# Patient Record
Sex: Female | Born: 1940 | Race: White | Hispanic: Yes | Marital: Married | Smoking: Never smoker
Health system: Southern US, Community
[De-identification: ages and names within clinical notes are randomized; demographics above are authoritative.]

## PROBLEM LIST (undated history)

## (undated) DIAGNOSIS — E079 Disorder of thyroid, unspecified: Secondary | ICD-10-CM

## (undated) DIAGNOSIS — M8580 Other specified disorders of bone density and structure, unspecified site: Secondary | ICD-10-CM

## (undated) DIAGNOSIS — M810 Age-related osteoporosis without current pathological fracture: Secondary | ICD-10-CM

## (undated) DIAGNOSIS — E559 Vitamin D deficiency, unspecified: Secondary | ICD-10-CM

## (undated) DIAGNOSIS — G500 Trigeminal neuralgia: Secondary | ICD-10-CM

## (undated) DIAGNOSIS — E039 Hypothyroidism, unspecified: Secondary | ICD-10-CM

## (undated) HISTORY — PX: TONSILLECTOMY: SUR1361

## (undated) HISTORY — PX: TUBAL LIGATION: SHX77

## (undated) HISTORY — DX: Hypothyroidism, unspecified: E03.9

## (undated) HISTORY — PX: BREAST EXCISIONAL BIOPSY: SUR124

## (undated) HISTORY — DX: Other specified disorders of bone density and structure, unspecified site: M85.80

## (undated) HISTORY — DX: Vitamin D deficiency, unspecified: E55.9

## (undated) HISTORY — PX: CATARACT EXTRACTION: SUR2

## (undated) HISTORY — PX: OTHER SURGICAL HISTORY: SHX169

## (undated) HISTORY — DX: Trigeminal neuralgia: G50.0

## (undated) HISTORY — PX: APPENDECTOMY: SHX54

---

## 2011-06-25 ENCOUNTER — Other Ambulatory Visit: Payer: Self-pay | Admitting: Family Medicine

## 2011-06-25 DIAGNOSIS — Z1231 Encounter for screening mammogram for malignant neoplasm of breast: Secondary | ICD-10-CM

## 2011-07-21 ENCOUNTER — Ambulatory Visit
Admission: RE | Admit: 2011-07-21 | Discharge: 2011-07-21 | Disposition: A | Discharge status: Home / Self Care | Payer: Medicare Other | Source: Ambulatory Visit | Attending: Family Medicine | Admitting: Family Medicine

## 2011-07-21 DIAGNOSIS — Z1231 Encounter for screening mammogram for malignant neoplasm of breast: Secondary | ICD-10-CM

## 2012-02-22 ENCOUNTER — Ambulatory Visit: Payer: Medicare Other | Attending: Family Medicine | Admitting: Physical Therapy

## 2012-02-22 DIAGNOSIS — M25659 Stiffness of unspecified hip, not elsewhere classified: Secondary | ICD-10-CM | POA: Insufficient documentation

## 2012-02-22 DIAGNOSIS — IMO0001 Reserved for inherently not codable concepts without codable children: Secondary | ICD-10-CM | POA: Insufficient documentation

## 2012-02-22 DIAGNOSIS — M545 Low back pain, unspecified: Secondary | ICD-10-CM | POA: Insufficient documentation

## 2012-06-12 ENCOUNTER — Other Ambulatory Visit: Payer: Self-pay | Admitting: Family Medicine

## 2012-06-12 DIAGNOSIS — Z1231 Encounter for screening mammogram for malignant neoplasm of breast: Secondary | ICD-10-CM

## 2012-08-07 ENCOUNTER — Ambulatory Visit: Payer: Medicare Other

## 2012-08-09 ENCOUNTER — Ambulatory Visit
Admission: RE | Admit: 2012-08-09 | Discharge: 2012-08-09 | Disposition: A | Discharge status: Home / Self Care | Payer: Medicare Other | Source: Ambulatory Visit | Attending: Family Medicine | Admitting: Family Medicine

## 2012-08-09 DIAGNOSIS — Z1231 Encounter for screening mammogram for malignant neoplasm of breast: Secondary | ICD-10-CM

## 2013-03-02 ENCOUNTER — Encounter: Payer: Self-pay | Admitting: Neurology

## 2013-03-02 ENCOUNTER — Ambulatory Visit (INDEPENDENT_AMBULATORY_CARE_PROVIDER_SITE_OTHER): Payer: Self-pay | Admitting: Neurology

## 2013-03-02 VITALS — BP 150/89 | HR 79 | Ht 59.0 in | Wt 122.0 lb

## 2013-03-02 DIAGNOSIS — G509 Disorder of trigeminal nerve, unspecified: Secondary | ICD-10-CM | POA: Insufficient documentation

## 2013-03-02 MED ORDER — PREGABALIN 75 MG PO CAPS: 75.0000 mg | ORAL_CAPSULE | Freq: Three times a day (TID) | ORAL | Status: DC

## 2013-03-02 NOTE — Patient Instructions (Signed)
  Take the Lyrica 50 mg twice during the day, and take 2 at night until the prescription is gone. Then start 75 mg three times a day.

## 2013-03-02 NOTE — Progress Notes (Signed)
   Reason for visit: Trigeminal neuralgia  Yolanda Garrett is an 72 y.o. female  History of present illness:  Yolanda Garrett is a 72 year old right-handed white female with a history of trigeminal neuralgia involving the right V2 distribution. The patient began having problems with the pain in 2008. The patient has undergone a gamma knife procedure in 2009 that initially was quite effective. In 2011, the pain returned, and the patient went on Lyrica. The patient has been off and on Lyrica since that time. The patient returns at this time because the pain is beginning to worsen. The patient is having daily episodes of pain. The patient is on 50 mg 3 times daily of the Lyrica, and she will have a wearing off effect, with pain prior to her next dose. The patient indicates that weather changes affect her pain level. The patient otherwise does not note any other new medical issues that have come up since last seen.  Past Medical History  Diagnosis Date  . Trigeminal neuralgia     Right  . Hypothyroidism   . Vitamin D deficiency   . Osteopenia     Past Surgical History  Procedure Laterality Date  . Gamma knife      procedure    Family History  Problem Relation Age of Onset  . Pneumonia Mother   . Diabetes Sister     Social history:  reports that she has never smoked. She does not have any smokeless tobacco history on file. She reports that she does not drink alcohol or use illicit drugs.  Allergies:  Allergies  Allergen Reactions  . Amoxicillin   . Codeine   . Trileptal (Oxcarbazepine)   . Ultram (Tramadol)     Medications:  No current outpatient prescriptions on file prior to visit.   No current facility-administered medications on file prior to visit.    ROS:  Out of a complete 14 system review of symptoms, the patient complains only of the following symptoms, and all other reviewed systems are negative.  Neuralgia pain  Blood pressure 150/89, pulse 79, height 4\' 11"  (1.499  m), weight 122 lb (55.339 kg).  Physical Exam  General: The patient is alert and cooperative at the time of the examination.  Skin: No significant peripheral edema is noted.   Neurologic Exam  Cranial nerves: Facial symmetry is present. Speech is normal, no aphasia or dysarthria is noted. Extraocular movements are full. Visual fields are full.  Motor: The patient has good strength in all 4 extremities.  Coordination: The patient has good finger-nose-finger and heel-to-shin bilaterally.  Gait and station: The patient has a normal gait. Tandem gait is normal. Romberg is negative. No drift is seen.  Reflexes: Deep tendon reflexes are symmetric.   Assessment/Plan:  One. Right trigeminal neuralgia, V2 distribution  The patient is having increased pain at this point. The patient will have an increase in the dosing of the Lyrica, taking 75 mg 3 times daily. The patient will use of the 50 mg tablets by taking one twice daily, and 2 at night. The patient will followup through this office in 4 months. The patient will contact me if the pain significantly worsens.  Marlan Palau MD 03/02/2013 9:56 AM  [facility] [address] [place], [place] [address]  Phone [phone] Fax [phone]

## 2013-04-17 ENCOUNTER — Telehealth: Payer: Self-pay | Admitting: *Deleted

## 2013-04-17 NOTE — Telephone Encounter (Signed)
Patient called stating she is having trigeminal pain and lyrica isn't working. Patient would like to speak with physician.

## 2013-04-17 NOTE — Telephone Encounter (Signed)
I called patient. The patient had an episode of some increased pain this morning with her trigeminal neuralgia. The patient is feeling better at this time. The patient currently is on 75 mg 3 times daily of the Lyrica. We could go up to 100 mg 3 times daily, but we will wait and see if the pain recurs. The patient will call me if this is the case.

## 2013-07-11 ENCOUNTER — Other Ambulatory Visit: Payer: Self-pay

## 2013-07-11 DIAGNOSIS — Z1231 Encounter for screening mammogram for malignant neoplasm of breast: Secondary | ICD-10-CM

## 2013-07-25 ENCOUNTER — Encounter: Payer: Self-pay | Admitting: Neurology

## 2013-07-25 ENCOUNTER — Ambulatory Visit (INDEPENDENT_AMBULATORY_CARE_PROVIDER_SITE_OTHER): Payer: Medicare Other | Admitting: Neurology

## 2013-07-25 VITALS — BP 151/90 | HR 75 | Wt 122.0 lb

## 2013-07-25 DIAGNOSIS — G509 Disorder of trigeminal nerve, unspecified: Secondary | ICD-10-CM

## 2013-07-25 MED ORDER — PREGABALIN 100 MG PO CAPS: 100.0000 mg | ORAL_CAPSULE | Freq: Three times a day (TID) | ORAL | Status: DC

## 2013-07-25 NOTE — Progress Notes (Signed)
Reason for visit: Trigeminal neuralgia  Yolanda Garrett is an 72 y.o. female  History of present illness:  Yolanda Garrett is a 72 year old right-handed white female with a history of right sided trigeminal neuralgia affecting the V2 distribution. The patient has had pain off and on, but within the last several days, the pain has significantly worsened. The patient was on Lyrica taking 75 mg 3 times daily, and she doubled the dose. This helped the pain, but she became dizzy and staggery on this dose. The patient had been doing fairly well previously, with only an occasional twinge of pain. The patient tolerated the 75 mg 3 times daily dose. The patient returns for an evaluation.   Past Medical History  Diagnosis Date  . Hypothyroidism   . Vitamin D deficiency   . Osteopenia   . Trigeminal neuralgia     Right V2 distribution    Past Surgical History  Procedure Laterality Date  . Gamma knife      procedure  . Tonsillectomy    . Appendectomy    . Tubal ligation Bilateral   . Cataract extraction Bilateral     Family History  Problem Relation Age of Onset  . Pneumonia Mother   . Diabetes Sister     Social history:  reports that she has never smoked. She does not have any smokeless tobacco history on file. She reports that she does not drink alcohol or use illicit drugs.    Allergies  Allergen Reactions  . Amoxicillin   . Codeine   . Trileptal [Oxcarbazepine]   . Ultram [Tramadol]     Medications:  Current Outpatient Prescriptions on File Prior to Visit  Medication Sig Dispense Refill  . Calcium Carbonate-Vitamin D (CALTRATE 600+D) 600-400 MG-UNIT per tablet Take 1 tablet by mouth daily.      . Ergocalciferol (VITAMIN D2) 2000 UNITS TABS Take 1 tablet by mouth daily.      Marland Kitchen levothyroxine (SYNTHROID, LEVOTHROID) 112 MCG tablet Take 112 mcg by mouth daily before breakfast. 2 days a week      . SYNTHROID 100 MCG tablet Take 100 mcg by mouth daily. 5 days a week       No current  facility-administered medications on file prior to visit.    ROS:  Out of a complete 14 system review of symptoms, the patient complains only of the following symptoms, and all other reviewed systems are negative.  Neuralgia pain   Blood pressure 151/90, pulse 75, weight 122 lb (55.339 kg).  Physical Exam  General: The patient is alert and cooperative at the time of the examination.  Skin: No significant peripheral edema is noted.   Neurologic Exam  Cranial nerves: Facial symmetry is present. Speech is normal, no aphasia or dysarthria is noted. Extraocular movements are full. Visual fields are full. There is no sensory alteration on the face.   Motor: The patient has good strength in all 4 extremities.  Coordination: The patient has good finger-nose-finger and heel-to-shin bilaterally.  Gait and station: The patient has a normal gait. Tandem gait is normal. Romberg is negative. No drift is seen.  Reflexes: Deep tendon reflexes are symmetric.   Assessment/Plan:  One. Trigeminal neuralgia, right  V2 distribution  The patient is having an exacerbation in trigeminal neuralgia pain recently. The patient will go up to Lyrica taking 100 mg 3 times daily. The patient indicates that medication such as carbamazepine and Trileptal caused her to "pass out" previously. The patient will be increased gradually  on the Lyrica as needed. The patient will followup in 4 or 5 months.   Marlan Palau MD 07/25/2013 12:41 PM  [facility] [address] [place], [place] [address]  Phone [phone] Fax [phone]

## 2013-08-01 ENCOUNTER — Ambulatory Visit: Payer: Self-pay | Admitting: Neurology

## 2013-08-10 ENCOUNTER — Ambulatory Visit
Admission: RE | Admit: 2013-08-10 | Discharge: 2013-08-10 | Disposition: A | Discharge status: Home / Self Care | Payer: Medicare Other | Source: Ambulatory Visit

## 2013-08-10 DIAGNOSIS — Z1231 Encounter for screening mammogram for malignant neoplasm of breast: Secondary | ICD-10-CM

## 2013-11-13 ENCOUNTER — Ambulatory Visit: Payer: Medicare Other | Admitting: Nurse Practitioner

## 2013-11-20 ENCOUNTER — Ambulatory Visit (INDEPENDENT_AMBULATORY_CARE_PROVIDER_SITE_OTHER): Payer: Medicare Other | Admitting: Nurse Practitioner

## 2013-11-20 ENCOUNTER — Encounter (INDEPENDENT_AMBULATORY_CARE_PROVIDER_SITE_OTHER): Payer: Self-pay

## 2013-11-20 ENCOUNTER — Encounter: Payer: Self-pay | Admitting: Nurse Practitioner

## 2013-11-20 VITALS — BP 129/80 | HR 74 | Ht 60.0 in | Wt 118.0 lb

## 2013-11-20 DIAGNOSIS — G509 Disorder of trigeminal nerve, unspecified: Secondary | ICD-10-CM

## 2013-11-20 MED ORDER — PREGABALIN 100 MG PO CAPS: 100.0000 mg | ORAL_CAPSULE | Freq: Three times a day (TID) | ORAL | Status: DC

## 2013-11-20 NOTE — Patient Instructions (Signed)
Will renew Lyrica 100 mg 3 times daily Followup in 6 months

## 2013-11-20 NOTE — Progress Notes (Signed)
[facility]  PATIENT: Yolanda Garrett DOB: Mar 19, 1941   REASON FOR VISIT: Followup for trigeminal neuralgia  HISTORY OF PRESENT ILLNESS:Yolanda Garrett, 72 year old white female returns for followup. She has a history of trigeminal neuralgia affecting the V2 distribution. It is currently well controlled on Lyrica 100 (3) times daily. She needs refills on her medication. No new neurologic complaints. She returns for reevaluation   HISTORY:of right sided trigeminal neuralgia affecting the V2 distribution. The patient has had pain off and on, but within the last several days, the pain has significantly worsened. The patient was on Lyrica taking 75 mg 3 times daily, and she doubled the dose. This helped the pain, but she became dizzy and staggery on this dose. The patient had been doing fairly well previously, with only an occasional twinge of pain. The patient tolerated the 75 mg 3 times daily dose.    REVIEW OF SYSTEMS: Full 14 system review of systems performed and notable only for those listed, all others are neg:  Constitutional: N/A  Cardiovascular: N/A  Ear/Nose/Throat: N/A  Skin: N/A  Eyes: N/A  Respiratory: N/A  Gastroitestinal: N/A  Hematology/Lymphatic: N/A  Endocrine: N/A Musculoskeletal:N/A  Allergy/Immunology: N/A  Neurological: N/A Psychiatric: N/A   ALLERGIES: Allergies  Allergen Reactions  . Amoxicillin   . Codeine   . Trileptal [Oxcarbazepine]   . Ultram [Tramadol]     HOME MEDICATIONS: Outpatient Prescriptions Prior to Visit  Medication Sig Dispense Refill  . Calcium Carbonate-Vitamin D (CALTRATE 600+D) 600-400 MG-UNIT per tablet Take 2 tablets by mouth daily.       Marland Kitchen levothyroxine (SYNTHROID, LEVOTHROID) 112 MCG tablet Take 112 mcg by mouth daily before breakfast. 2 days a week      . pregabalin (LYRICA) 100 MG capsule Take 1 capsule (100 mg total) by mouth 3 (three) times daily.  90 capsule  3  . SYNTHROID 100 MCG tablet Take 100 mcg by mouth  daily. 5 days a week      . Ergocalciferol (VITAMIN D2) 2000 UNITS TABS Take 1 tablet by mouth daily.       No facility-administered medications prior to visit.    PAST MEDICAL HISTORY: Past Medical History  Diagnosis Date  . Hypothyroidism   . Vitamin D deficiency   . Osteopenia   . Trigeminal neuralgia     Right V2 distribution    PAST SURGICAL HISTORY: Past Surgical History  Procedure Laterality Date  . Gamma knife      procedure  . Tonsillectomy    . Appendectomy    . Tubal ligation Bilateral   . Cataract extraction Bilateral     FAMILY HISTORY: Family History  Problem Relation Age of Onset  . Pneumonia Mother   . Diabetes Sister     SOCIAL HISTORY: History   Social History  . Marital Status: Married    Spouse Name: Yolanda Garrett    Number of Children: 2  . Years of Education: Bachelors   Occupational History  . Retired Runner, broadcasting/film/video    Social History Main Topics  . Smoking status: Never Smoker   . Smokeless tobacco: Never Used  . Alcohol Use: No  . Drug Use: No  . Sexual Activity: Not on file   Other Topics Concern  . Not on file   Social History Narrative   Patient is married Yolanda Garrett) and lives at home with her husband.   Patient has twin sons.   Patient is retired.   Patient has a Probation officer.   Patient  is right-handed.   Patient drinks three cups of de-caffeinated coffee daily.     PHYSICAL EXAM  Filed Vitals:   11/20/13 1058  BP: 129/80  Pulse: 74  Height: 5' (1.524 m)  Weight: 118 lb (53.524 kg)   Body mass index is 23.05 kg/(m^2).  Generalized: Well developed, in no acute distress  Skin  no peripheral edema  Neurological examination   Mentation: Alert oriented to time, place, history taking. Follows all commands speech and language fluent  Cranial nerve II-XII: Pupils were equal round reactive to light extraocular movements were full, visual field were full on confrontational test. Facial sensation and strength were normal. hearing was  intact to finger rubbing bilaterally. Uvula tongue midline. head turning and shoulder shrug were normal and symmetric.Tongue protrusion into cheek strength was normal. Motor: normal bulk and tone, full strength in the BUE, BLE, fine finger movements normal, no pronator drift. No focal weakness Coordination: finger-nose-finger, heel-to-shin bilaterally, no dysmetria Reflexes: Brachioradialis 2/2, biceps 2/2, triceps 2/2, patellar 2/2, Achilles 2/2, plantar responses were flexor bilaterally. Gait and Station: Rising up from seated position without assistance, normal stance,  moderate stride, good arm swing, smooth turning, able to perform tiptoe, and heel walking without difficulty. Tandem gait is steady  DIAGNOSTIC DATA (LABS, IMAGING, TESTING) -None to review   ASSESSMENT AND PLAN  72 y.o. year old female  has a past medical history of Hypothyroidism; Vitamin D deficiency; Osteopenia; and Trigeminal neuralgia. here to followup. Her trigeminal neuralgia is currently well controlled with Lyrica 100 mg 3 times daily  Will renew Lyrica 100 mg 3 times daily Followup in 6 months Nilda Riggs, [facility], [facility], APRN  [facility] [address] [place], [place] [address] [phone]

## 2013-11-20 NOTE — Progress Notes (Signed)
I have read the note, and I agree with the clinical assessment and plan.  WILLIS,CHARLES KEITH   

## 2014-05-09 ENCOUNTER — Encounter: Payer: Self-pay | Admitting: Neurology

## 2014-05-09 ENCOUNTER — Ambulatory Visit (INDEPENDENT_AMBULATORY_CARE_PROVIDER_SITE_OTHER): Payer: Medicare Other | Admitting: Neurology

## 2014-05-09 ENCOUNTER — Encounter (INDEPENDENT_AMBULATORY_CARE_PROVIDER_SITE_OTHER): Payer: Self-pay

## 2014-05-09 VITALS — BP 136/81 | HR 68 | Wt 122.0 lb

## 2014-05-09 DIAGNOSIS — G500 Trigeminal neuralgia: Secondary | ICD-10-CM | POA: Insufficient documentation

## 2014-05-09 DIAGNOSIS — G509 Disorder of trigeminal nerve, unspecified: Secondary | ICD-10-CM

## 2014-05-09 MED ORDER — PREGABALIN 100 MG PO CAPS: 100.0000 mg | ORAL_CAPSULE | Freq: Three times a day (TID) | ORAL | Status: DC

## 2014-05-09 NOTE — Patient Instructions (Signed)
Trigeminal Neuralgia Trigeminal neuralgia is a nerve disorder that causes sudden attacks of severe facial pain. It is caused by damage to the trigeminal nerve, a major nerve in the face. It is more common in women and in the elderly, although it can also happen in younger patients. Attacks last from a few seconds to several minutes and can occur from a couple of times per year to several times per day. Trigeminal neuralgia can be a very distressing and disabling condition. Surgery may be needed in very severe cases if medical treatment does not give relief. HOME CARE INSTRUCTIONS   If your caregiver prescribed medication to help prevent attacks, take as directed.  To help prevent attacks:  Chew on the unaffected side of the mouth.  Avoid touching your face.  Avoid blasts of hot or cold air.  Men may wish to grow a beard to avoid having to shave. SEEK IMMEDIATE MEDICAL CARE IF:  Pain is unbearable and your medicine does not help.  You develop new, unexplained symptoms (problems).  You have problems that may be related to a medication you are taking. Document Released: 11/12/2000 Document Revised: 02/07/2012 Document Reviewed: 09/12/2009 Exit[employer] Patient Information 2014 [employer].  

## 2014-05-09 NOTE — Progress Notes (Signed)
Reason for visit: Trigeminal neuralgia  Yolanda Garrett is an 73 y.o. female  History of present illness:  Yolanda Garrett is a 73 year old right-handed white female with a history of trigeminal neuralgia in the right V2 distribution. Overall, she has been doing relatively well with the pain. The patient is on Lyrica taking 100 mg 3 times daily. She indicates that she will have occasional twinges of pain, and on occasion, she will have more significant discomfort lasting several minutes. The patient may go up to 4 weeks without any pain. She indicates that weather changes may activate the pain. Overall, she believes that she is functioning much better than she has in the past. She reports no other new medical issues that have come up since last seen. She returns to this office for an evaluation.  Past Medical History  Diagnosis Date  . Hypothyroidism   . Vitamin D deficiency   . Osteopenia   . Trigeminal neuralgia     Right V2 distribution    Past Surgical History  Procedure Laterality Date  . Gamma knife      procedure  . Tonsillectomy    . Appendectomy    . Tubal ligation Bilateral   . Cataract extraction Bilateral     Family History  Problem Relation Age of Onset  . Pneumonia Mother   . Diabetes Sister     Social history:  reports that she has never smoked. She has never used smokeless tobacco. She reports that she does not drink alcohol or use illicit drugs.    Allergies  Allergen Reactions  . Amoxicillin   . Codeine   . Trileptal [Oxcarbazepine]   . Ultram [Tramadol]     Medications:  Current Outpatient Prescriptions on File Prior to Visit  Medication Sig Dispense Refill  . Calcium Carbonate-Vitamin D (CALTRATE 600+D) 600-400 MG-UNIT per tablet Take 2 tablets by mouth daily.       . Cholecalciferol (VITAMIN D3) 2000 UNITS TABS Take 1 tablet by mouth daily.      Marland Kitchen levothyroxine (SYNTHROID, LEVOTHROID) 112 MCG tablet Take 112 mcg by mouth daily before breakfast. 2  days a week      . SYNTHROID 100 MCG tablet Take 100 mcg by mouth daily. 5 days a week       No current facility-administered medications on file prior to visit.    ROS:  Out of a complete 14 system review of symptoms, the patient complains only of the following symptoms, and all other reviewed systems are negative.  Neuralgia pain  Blood pressure 136/81, pulse 68, weight 122 lb (55.339 kg).  Physical Exam  General: The patient is alert and cooperative at the time of the examination.  Skin: No significant peripheral edema is noted.   Neurologic Exam  Mental status: The patient is oriented x 3.  Cranial nerves: Facial symmetry is present. Speech is normal, no aphasia or dysarthria is noted. Extraocular movements are full. Visual fields are full.  Motor: The patient has good strength in all 4 extremities.  Sensory examination: Soft touch sensation is symmetric on the face, arms, and legs.  Coordination: The patient has good finger-nose-finger and heel-to-shin bilaterally.  Gait and station: The patient has a normal gait. Tandem gait is normal. Romberg is negative. No drift is seen.  Reflexes: Deep tendon reflexes are symmetric.   Assessment/Plan:  1. Right V2 distribution trigeminal neuralgia  The patient is doing relatively well on Lyrica at this point. The past, she has not tolerated  a lot of other medications for her neuralgia pain. She will continue this medication, and a prescription was written today. She will followup in 6-8 months.  Jill Alexanders MD 05/09/2014 2:54 PM  [facility] [address] [place], [place] [address]  Phone [phone] Fax [phone]

## 2014-05-10 ENCOUNTER — Ambulatory Visit: Payer: Medicare Other | Admitting: Neurology

## 2014-05-21 ENCOUNTER — Ambulatory Visit: Payer: Medicare Other | Admitting: Neurology

## 2014-07-10 ENCOUNTER — Other Ambulatory Visit: Payer: Self-pay

## 2014-07-10 DIAGNOSIS — Z1231 Encounter for screening mammogram for malignant neoplasm of breast: Secondary | ICD-10-CM

## 2014-08-16 ENCOUNTER — Ambulatory Visit
Admission: RE | Admit: 2014-08-16 | Discharge: 2014-08-16 | Disposition: A | Discharge status: Home / Self Care | Payer: Medicare Other | Source: Ambulatory Visit

## 2014-08-16 ENCOUNTER — Encounter (INDEPENDENT_AMBULATORY_CARE_PROVIDER_SITE_OTHER): Payer: Self-pay

## 2014-08-16 DIAGNOSIS — Z1231 Encounter for screening mammogram for malignant neoplasm of breast: Secondary | ICD-10-CM

## 2014-10-16 ENCOUNTER — Encounter: Payer: Self-pay | Admitting: Neurology

## 2014-10-22 ENCOUNTER — Encounter: Payer: Self-pay | Admitting: Neurology

## 2014-11-07 ENCOUNTER — Other Ambulatory Visit: Payer: Self-pay | Admitting: Family Medicine

## 2014-11-07 ENCOUNTER — Ambulatory Visit
Admission: RE | Admit: 2014-11-07 | Discharge: 2014-11-07 | Disposition: A | Discharge status: Home / Self Care | Payer: Medicare Other | Source: Ambulatory Visit | Attending: Family Medicine | Admitting: Family Medicine

## 2014-11-07 DIAGNOSIS — R9389 Abnormal findings on diagnostic imaging of other specified body structures: Secondary | ICD-10-CM

## 2014-11-08 ENCOUNTER — Ambulatory Visit: Payer: Self-pay | Admitting: Nurse Practitioner

## 2014-11-13 ENCOUNTER — Other Ambulatory Visit: Payer: Self-pay | Admitting: Family Medicine

## 2014-11-13 ENCOUNTER — Ambulatory Visit
Admission: RE | Admit: 2014-11-13 | Discharge: 2014-11-13 | Disposition: A | Discharge status: Home / Self Care | Payer: Medicare Other | Source: Ambulatory Visit | Attending: Family Medicine | Admitting: Family Medicine

## 2014-11-13 DIAGNOSIS — R200 Anesthesia of skin: Secondary | ICD-10-CM

## 2014-11-18 ENCOUNTER — Other Ambulatory Visit: Payer: Self-pay

## 2014-11-18 NOTE — Telephone Encounter (Signed)
Dr Jannifer Franklin is out of the office, forwarding request to [facility] for approval

## 2014-11-19 MED ORDER — PREGABALIN 100 MG PO CAPS: 100.0000 mg | ORAL_CAPSULE | Freq: Three times a day (TID) | ORAL | Status: DC

## 2014-11-19 NOTE — Telephone Encounter (Signed)
Rx signed and faxed.

## 2015-02-12 ENCOUNTER — Ambulatory Visit (INDEPENDENT_AMBULATORY_CARE_PROVIDER_SITE_OTHER): Payer: Medicare Other | Admitting: Neurology

## 2015-02-12 ENCOUNTER — Encounter: Payer: Self-pay | Admitting: Neurology

## 2015-02-12 VITALS — BP 117/78 | HR 76 | Ht 60.0 in | Wt 125.2 lb

## 2015-02-12 DIAGNOSIS — G500 Trigeminal neuralgia: Secondary | ICD-10-CM | POA: Diagnosis not present

## 2015-02-12 NOTE — Progress Notes (Signed)
Reason for visit: Trigeminal neuralgia  Yolanda Garrett is an 74 y.o. female  History of present illness:  Ms. Garin is a 74 year old right-handed white female with a history of a right V2 trigeminal neuralgia. The patient has done quite well since last seen. She is on Lyrica taking 100 mg 3 times daily. She indicates that she will get an occasional twinge of pain in the right face, and around the right eye, particularly if there is a cold front moving in with the weather. The patient reports that she is able to talk, chew, and swallow without difficulty. She recently has noted some problems with numbness of the right forearm and hand, she feels weak in the right arm. He has been seen by her orthopedic doctor, and she was told she had a pinched nerve in the neck, but she denies any neck or arm or shoulder discomfort. MRI of the cervical spine was done. The scan results are not available to me. The patient returns to this office for further evaluation.  Past Medical History  Diagnosis Date  . Hypothyroidism   . Vitamin D deficiency   . Osteopenia   . Trigeminal neuralgia     Right V2 distribution    Past Surgical History  Procedure Laterality Date  . Gamma knife      procedure  . Tonsillectomy    . Appendectomy    . Tubal ligation Bilateral   . Cataract extraction Bilateral     Family History  Problem Relation Age of Onset  . Pneumonia Mother   . Diabetes Sister     Social history:  reports that she has never smoked. She has never used smokeless tobacco. She reports that she does not drink alcohol or use illicit drugs.    Allergies  Allergen Reactions  . Amoxicillin   . Codeine   . Trileptal [Oxcarbazepine]   . Ultram [Tramadol]     Medications:  Prior to Admission medications   Medication Sig Start Date End Date Taking? Authorizing Provider  Calcium Carbonate-Vitamin D (CALTRATE 600+D) 600-400 MG-UNIT per tablet Take 2 tablets by mouth daily.    Yes Historical  Provider, MD  Cholecalciferol (VITAMIN D3) 2000 UNITS TABS Take 1 tablet by mouth daily.   Yes Historical Provider, MD  ibuprofen (ADVIL,MOTRIN) 800 MG tablet Take 1 tablet by mouth as needed. 02/26/14  Yes Historical Provider, MD  levothyroxine (SYNTHROID, LEVOTHROID) 88 MCG tablet Take 88 mcg by mouth daily before breakfast.   Yes Historical Provider, MD  pregabalin (LYRICA) 100 MG capsule Take 1 capsule (100 mg total) by mouth 3 (three) times daily. 11/19/14  Yes Marcial Pacas, MD    ROS:  Out of a complete 14 system review of symptoms, the patient complains only of the following symptoms, and all other reviewed systems are negative.  Right arm numbness  Blood pressure 117/78, pulse 76, height 5' (1.524 m), weight 125 lb 3.2 oz (56.79 kg).  Physical Exam  General: The patient is alert and cooperative at the time of the examination.  Skin: No significant peripheral edema is noted.   Neurologic Exam  Mental status: The patient is alert and oriented x 3 at the time of the examination. The patient has apparent normal recent and remote memory, with an apparently normal attention span and concentration ability.   Cranial nerves: Facial symmetry is present. Speech is normal, no aphasia or dysarthria is noted. Extraocular movements are full. Visual fields are full.  Motor: The patient has  good strength in all 4 extremities.  Sensory examination: Soft touch sensation is symmetric on the face, arms, and legs.  Coordination: The patient has good finger-nose-finger and heel-to-shin bilaterally. Tinel's sign on the wrists is negative bilaterally.  Gait and station: The patient has a normal gait. The patient appears to have good arm swing with walking. Tandem gait is normal. Romberg is negative. No drift is seen.  Reflexes: Deep tendon reflexes are symmetric.   Assessment/Plan:  1. Trigeminal neuralgia, right V2 distribution  2. Right arm numbness, weakness  The patient is doing well  with her trigeminal neuralgia. This appears to be relatively stable. The patient is having new issues with right arm numbness, she has been told that she has a cervical radiculopathy, but she has no pain. If this issue persists, EMG and nerve conduction study of the right arm may be helpful. The patient will contact our office if she is not doing well in this regard. Otherwise, she will follow-up in one year.  Jill Alexanders MD 02/12/2015 9:06 PM  [facility] [address] [place], [place] [address]  Phone [phone] Fax [phone]

## 2015-02-12 NOTE — Patient Instructions (Signed)
Trigeminal Neuralgia  Trigeminal neuralgia is a nerve disorder that causes sudden attacks of severe facial pain. It is caused by damage to the trigeminal nerve, a major nerve in the face. It is more common in women and in the elderly, although it can also happen in younger patients. Attacks last from a few seconds to several minutes and can occur from a couple of times per year to several times per day. Trigeminal neuralgia can be a very distressing and disabling condition. Surgery may be needed in very severe cases if medical treatment does not give relief.  HOME CARE INSTRUCTIONS    If your caregiver prescribed medication to help prevent attacks, take as directed.   To help prevent attacks:   Chew on the unaffected side of the mouth.   Avoid touching your face.   Avoid blasts of hot or cold air.   Men may wish to grow a beard to avoid having to shave.  SEEK IMMEDIATE MEDICAL CARE IF:   Pain is unbearable and your medicine does not help.   You develop new, unexplained symptoms (problems).   You have problems that may be related to a medication you are taking.  Document Released: 11/12/2000 Document Revised: 02/07/2012 Document Reviewed: 09/12/2009  [employer] Patient Information 2015 [employer]. This information is not intended to replace advice given to you by your health care provider. Make sure you discuss any questions you have with your health care provider.

## 2015-05-18 ENCOUNTER — Other Ambulatory Visit: Payer: Self-pay | Admitting: Neurology

## 2015-05-20 ENCOUNTER — Other Ambulatory Visit: Payer: Self-pay

## 2015-05-20 MED ORDER — PREGABALIN 100 MG PO CAPS: 100.0000 mg | ORAL_CAPSULE | Freq: Three times a day (TID) | ORAL | Status: DC

## 2015-05-20 NOTE — Telephone Encounter (Signed)
Rx signed and faxed.

## 2015-07-16 ENCOUNTER — Other Ambulatory Visit: Payer: Self-pay

## 2015-07-16 DIAGNOSIS — Z1231 Encounter for screening mammogram for malignant neoplasm of breast: Secondary | ICD-10-CM

## 2015-08-19 ENCOUNTER — Ambulatory Visit
Admission: RE | Admit: 2015-08-19 | Discharge: 2015-08-19 | Disposition: A | Discharge status: Home / Self Care | Payer: Medicare Other | Source: Ambulatory Visit

## 2015-08-19 DIAGNOSIS — Z1231 Encounter for screening mammogram for malignant neoplasm of breast: Secondary | ICD-10-CM

## 2015-11-21 ENCOUNTER — Telehealth: Payer: Self-pay | Admitting: Diagnostic Neuroimaging

## 2015-11-21 MED ORDER — PREGABALIN 100 MG PO CAPS: 100.0000 mg | ORAL_CAPSULE | Freq: Three times a day (TID) | ORAL | Status: DC

## 2015-11-21 NOTE — Telephone Encounter (Signed)
Returned pt call. Needs lyrica refill. Called in 1 month supply today. Will have have paper rx sent in next week. -VRP

## 2015-12-16 ENCOUNTER — Other Ambulatory Visit: Payer: Self-pay | Admitting: Neurology

## 2016-02-12 ENCOUNTER — Ambulatory Visit (INDEPENDENT_AMBULATORY_CARE_PROVIDER_SITE_OTHER): Payer: Medicare Other | Admitting: Adult Health

## 2016-02-12 ENCOUNTER — Encounter: Payer: Self-pay | Admitting: Adult Health

## 2016-02-12 VITALS — BP 143/89 | HR 66 | Resp 14 | Ht 60.0 in | Wt 126.0 lb

## 2016-02-12 DIAGNOSIS — G500 Trigeminal neuralgia: Secondary | ICD-10-CM

## 2016-02-12 NOTE — Patient Instructions (Signed)
Continue on Lyrica 100 mg 3 times a day If your symptoms worsen or you develop new symptoms please let us know.

## 2016-02-12 NOTE — Progress Notes (Signed)
I have read the note, and I agree with the clinical assessment and plan.  Ragen Laver KEITH   

## 2016-02-12 NOTE — Progress Notes (Signed)
PATIENT: Yolanda Garrett [facility] DOB: 1941-03-01  REASON FOR VISIT: follow up- trigeminal neuralgia HISTORY FROM: patient  HISTORY OF PRESENT ILLNESS: Yolanda Garrett is a 75 year old female with a history of a right V2 trigeminal neuralgia. She returns today for follow-up. She is currently taking Lyrica 100 mg 3 times a day. She states in the last 3 weeks she's had "small attacks" that are relieved with ibuprofen. She reports that a trigger for her is the weather. She also recently had a mole removed from the right cheek and is unsure if this triggered her discomfort. She states as of now she does not want to increase the Lyrica as it can make her drowsy throughout the day. She states that she does not operate a motor vehicle. She lets her husband drive her places. She states that if her symptoms do not stabilize then she may consider increasing her medication. She denies any new neurological symptoms. She returns today for an evaluation.  HISTORY 02/12/15: Yolanda Garrett is a 75 year old right-handed white female with a history of a right V2 trigeminal neuralgia. The patient has done quite well since last seen. She is on Lyrica taking 100 mg 3 times daily. She indicates that she will get an occasional twinge of pain in the right face, and around the right eye, particularly if there is a cold front moving in with the weather. The patient reports that she is able to talk, chew, and swallow without difficulty. She recently has noted some problems with numbness of the right forearm and hand, she feels weak in the right arm. He has been seen by her orthopedic doctor, and she was told she had a pinched nerve in the neck, but she denies any neck or arm or shoulder discomfort. MRI of the cervical spine was done. The scan results are not available to me. The patient returns to this office for further evaluation.  REVIEW OF SYSTEMS: Out of a complete 14 system review of symptoms, the patient complains only of the following  symptoms, and all other reviewed systems are negative.  See history of present illness  ALLERGIES: Allergies  Allergen Reactions  . Amoxicillin   . Codeine   . Trileptal [Oxcarbazepine]   . Ultram [Tramadol]     HOME MEDICATIONS: Outpatient Prescriptions Prior to Visit  Medication Sig Dispense Refill  . Calcium Carbonate-Vitamin D (CALTRATE 600+D) 600-400 MG-UNIT per tablet Take 2 tablets by mouth daily.     . Cholecalciferol (VITAMIN D3) 2000 UNITS TABS Take 1 tablet by mouth daily.    Marland Kitchen levothyroxine (SYNTHROID, LEVOTHROID) 88 MCG tablet Take 88 mcg by mouth daily before breakfast.    . LYRICA 100 MG capsule TAKE ONE CAPSULE BY MOUTH THREE TIMES DAILY 90 capsule 5  . ibuprofen (ADVIL,MOTRIN) 800 MG tablet Take 200 mg by mouth as needed.      No facility-administered medications prior to visit.    PAST MEDICAL HISTORY: Past Medical History  Diagnosis Date  . Hypothyroidism   . Vitamin D deficiency   . Osteopenia   . Trigeminal neuralgia     Right V2 distribution    PAST SURGICAL HISTORY: Past Surgical History  Procedure Laterality Date  . Gamma knife      procedure  . Tonsillectomy    . Appendectomy    . Tubal ligation Bilateral   . Cataract extraction Bilateral     FAMILY HISTORY: Family History  Problem Relation Age of Onset  . Pneumonia Mother   . Diabetes  Sister     SOCIAL HISTORY: Social History   Social History  . Marital Status: Married    Spouse Name: Mikki Santee  . Number of Children: 2  . Years of Education: Bachelors   Occupational History  . Retired Pharmacist, hospital    Social History Main Topics  . Smoking status: Never Smoker   . Smokeless tobacco: Never Used  . Alcohol Use: No  . Drug Use: No  . Sexual Activity: Not on file   Other Topics Concern  . Not on file   Social History Narrative   Patient is married Mikki Santee) and lives at home with her husband.   Patient has twin sons.   Patient is retired.   Patient has a Haematologist.   Patient  is right-handed.   Patient drinks three cups of de-caffeinated coffee daily.      PHYSICAL EXAM  Filed Vitals:   02/12/16 0827  BP: 143/89  Pulse: 66  Resp: 14  Height: 5' (1.524 m)  Weight: 126 lb (57.153 kg)   Body mass index is 24.61 kg/(m^2).  Generalized: Well developed, in no acute distress   Neurological examination  Mentation: Alert oriented to time, place, history taking. Follows all commands speech and language fluent Cranial nerve II-XII: Pupils were equal round reactive to light. Extraocular movements were full, visual field were full on confrontational test. Facial sensation and strength were normal. Uvula tongue midline. Head turning and shoulder shrug  were normal and symmetric. Motor: The motor testing reveals 5 over 5 strength In the left upper extremity in the lower extremities. 4/5 strength in the right upper extremity. She states that the weakness in her right arm is due to a cervical injury. Good symmetric motor tone is noted throughout.  Sensory: Sensory testing is intact to soft touch on all 4 extremities. No evidence of extinction is noted.  Coordination: Cerebellar testing reveals good finger-nose-finger and heel-to-shin bilaterally.  Gait and station: Gait is normal. Tandem gait is normal. Romberg is negative. No drift is seen.  Reflexes: Deep tendon reflexes are symmetric and normal bilaterally.   DIAGNOSTIC DATA (LABS, IMAGING, TESTING) - I reviewed patient records, labs, notes, testing and imaging myself where available.     ASSESSMENT AND PLAN 75 y.o. year old female  has a past medical history of Hypothyroidism; Vitamin D deficiency; Osteopenia; and Trigeminal neuralgia. here with:  1. Trigeminal neuralgia  Overall the patient is doing well. She'll continue on Lyrica 100 mg 3 times a day. If she continues to have symptoms we may consider increasing the Lyrica. For now she can continue using ibuprofen as needed. Patient advised that if her  symptoms do not improve she should let us know.. She will follow-up in one year with Dr. Jannifer Franklin.   Ward Givens, MSN, NP-C 02/12/2016, 8:34 AM [facility] [address], [place], [place] [address] [phone]

## 2016-03-26 ENCOUNTER — Encounter: Payer: Self-pay | Admitting: *Deleted

## 2016-03-26 ENCOUNTER — Telehealth: Payer: Self-pay | Admitting: Neurology

## 2016-03-26 DIAGNOSIS — G500 Trigeminal neuralgia: Secondary | ICD-10-CM

## 2016-03-26 MED ORDER — PREGABALIN 50 MG PO CAPS: 50.0000 mg | ORAL_CAPSULE | Freq: Three times a day (TID) | ORAL | Status: DC

## 2016-03-26 NOTE — Progress Notes (Signed)
Faxed printed rx pregabalin from Dr Rexene Alberts to pt pharmacy. FaxAJ:[phone]. Received confirmation.

## 2016-03-26 NOTE — Telephone Encounter (Signed)
Pt's husband called said pt sts she is having the worst trigeminal attack she's ever had. She cannot talk due to the pain. She said the pain this morning has been far less. She takes LYRICA 100 MG capsule 3 x day, last night she took 200mg  so it was a total of 400mg  for the day. He is inquiring if the lyrica should be increased and if so, RX should be called to [facility]. Please call

## 2016-03-26 NOTE — Telephone Encounter (Signed)
Pt's husband called back said he has not heard back from a provider. Husband was advised this message would be sent again and forwarded to another provider also so this would be attended to soon

## 2016-03-26 NOTE — Telephone Encounter (Signed)
Spoke to patient's husband re: patient's trigeminal neuralgia flareup. He says it is as bad as when she first got diagnosed several years ago. She had gamma knife surgery as I understand in 2008. She took an extra pill of Lyrica last night along with ibuprofen and was able to sleep some. I suggested a short course of additional 50 mg strength Lyrica to take 3 times a day along with her 100 mg 3 times a day dosing which she usually takes. She can take an additional 50 mg 3 times a day for up to 10 days. Prescription will be faxed to her retail pharmacy on file. He is encouraged to give [place] an update next week as to how she is doing and to see if she needs to come in for a sooner than scheduled appointment or needs some other medication tweaking as per Dr. Jannifer Franklin.

## 2016-03-26 NOTE — Telephone Encounter (Signed)
Megan M.called this operator and said Dr Rexene Alberts will be in the office shortly and will take care of this asap.

## 2016-06-14 ENCOUNTER — Other Ambulatory Visit: Payer: Self-pay | Admitting: Neurology

## 2016-06-16 ENCOUNTER — Other Ambulatory Visit: Payer: Self-pay | Admitting: Neurology

## 2016-06-16 NOTE — Telephone Encounter (Signed)
Rx printed, signed, faxed to pharmacy. 

## 2016-07-14 ENCOUNTER — Other Ambulatory Visit: Payer: Self-pay | Admitting: Neurology

## 2016-07-14 NOTE — Telephone Encounter (Signed)
Rx printed, signed, faxed to pharmacy. 

## 2016-07-21 ENCOUNTER — Telehealth: Payer: Self-pay | Admitting: Neurology

## 2016-07-21 MED ORDER — PREGABALIN 50 MG PO CAPS: 50.0000 mg | ORAL_CAPSULE | Freq: Three times a day (TID) | ORAL | 2 refills | Status: DC

## 2016-07-21 NOTE — Telephone Encounter (Signed)
Rx printed and faxed into requested pharmacy

## 2016-07-21 NOTE — Telephone Encounter (Signed)
I called patient. The patient had a flareup of the trigeminal neuralgia, I will add the 50 mg capsules to the 100s taking 150 mg 3 times daily. If the patient continues to have pain every time she got back to just taking 100 mg 3 times daily, we will switch her to the 150 mg capsules of the Lyrica to take on a scheduled basis.

## 2016-07-21 NOTE — Telephone Encounter (Signed)
I spoke to patient and she reports having a flare up of her Trigeminal Neuralgia. She states during last flare up she was given an addition prescription for Lyrica 50 mg, therefore was taking 150 mg three times a day for a couple of days and then weaned back down to the 100 mg three times a day. She asks if she can get same prescription sent to [facility] on [address].

## 2016-07-21 NOTE — Telephone Encounter (Signed)
Patient called to advise, Trigeminal Neuralgia active this month, "flared up", takes extra of LYRICA 100 MG capsule during flare up, states Lyrica is working except when she gets flare up and takes extra of this medication, is running low on this medication.

## 2016-07-22 MED ORDER — PREDNISONE 10 MG PO TABS: ORAL_TABLET | ORAL | 0 refills | Status: DC

## 2016-07-22 NOTE — Telephone Encounter (Signed)
I called patient, talk with husband. The neuralgia pain has become quite severe, she is to continue the Lyrica, I will call in a prednisone Dosepak.

## 2016-07-22 NOTE — Addendum Note (Signed)
Addended by: Margette Fast on: 07/22/2016 05:35 PM   Modules accepted: Orders

## 2016-07-22 NOTE — Telephone Encounter (Signed)
Returned TC and spoke to pt's husband. He reports that pt's unable to talk but put on the speaker phone during call. Due to excruciating pain this morning, pt increased Lyrica even further to 200 mg and has taken 2 doses so far today. Because of the severe pain, she's had difficulty opening her mouth interfering w/ both talking and eating. She has not taken anything else for pain but agreed to try OTC ibuprofen, which she has used in the past, for comfort. Husband said that gamma knife procedure by dentist helped in the past. She's also used pain patches (Lidoderm?) that offered some relief but pt says that she can no longer use them.

## 2016-07-22 NOTE — Telephone Encounter (Signed)
Spouse called to advise, increase in LYRICA isn't helping, please advise what else to do?

## 2016-07-26 MED ORDER — PHENYTOIN SODIUM EXTENDED 100 MG PO CAPS: 200.0000 mg | ORAL_CAPSULE | Freq: Every day | ORAL | 1 refills | Status: DC

## 2016-07-26 NOTE — Telephone Encounter (Signed)
Called and spoke to pt. Revisit scheduled for next Thursday.

## 2016-07-26 NOTE — Addendum Note (Signed)
Addended by: Margette Fast on: 07/26/2016 01:05 PM   Modules accepted: Orders

## 2016-07-26 NOTE — Telephone Encounter (Signed)
The patient is on prednisone, this has helped slightly but the patient still has pain when she tries to talk or chew. The patient is to remain on Lyrica taking 50 mg 3 times daily, she could not tolerate Trileptal or carbamazepine previously. I will try Dilantin added to the regimen. I will get a revisit next week.

## 2016-07-26 NOTE — Telephone Encounter (Signed)
Spouse called back with wife listening in background, states she has taken prescribed medication and states there is no improvement.

## 2016-08-05 ENCOUNTER — Ambulatory Visit (INDEPENDENT_AMBULATORY_CARE_PROVIDER_SITE_OTHER): Payer: Medicare Other | Admitting: Neurology

## 2016-08-05 ENCOUNTER — Encounter: Payer: Self-pay | Admitting: Neurology

## 2016-08-05 VITALS — BP 144/86 | HR 83 | Ht 60.0 in | Wt 114.0 lb

## 2016-08-05 DIAGNOSIS — G500 Trigeminal neuralgia: Secondary | ICD-10-CM | POA: Diagnosis not present

## 2016-08-05 NOTE — Progress Notes (Signed)
Reason for visit: Trigeminal neuralgia  Yolanda Garrett is an 75 y.o. female  History of present illness:  Yolanda Garrett is a 75 year old right-handed white female with a history of right sided trigeminal neuralgia in the V2 distribution. The patient has had an exacerbation of her pain in the last couple weeks, the Lyrica has been increased to 150 mg 3 times daily, Dilantin was added to the regimen taking 200 mg at night, and she was given a prednisone Dosepak. She has had no pain in the last week, but she is still frightened about returning to normal eating, she is using nutritional shakes to eat because she does not have to chew. She brushed her teeth for the first time this morning. The patient is able to talk without pain. She reports a full sensation in the left ear. She feels wobbly, staggery on the medication, sleepy. The patient returns to the office today for an evaluation.  Past Medical History:  Diagnosis Date  . Hypothyroidism   . Osteopenia   . Trigeminal neuralgia    Right V2 distribution  . Vitamin D deficiency     Past Surgical History:  Procedure Laterality Date  . APPENDECTOMY    . CATARACT EXTRACTION Bilateral   . gamma knife     procedure  . TONSILLECTOMY    . TUBAL LIGATION Bilateral     Family History  Problem Relation Age of Onset  . Pneumonia Mother   . Diabetes Sister     Social history:  reports that she has never smoked. She has never used smokeless tobacco. She reports that she does not drink alcohol or use drugs.    Allergies  Allergen Reactions  . Amoxicillin   . Codeine   . Trileptal [Oxcarbazepine]   . Ultram [Tramadol]     Medications:  Prior to Admission medications   Medication Sig Start Date End Date Taking? Authorizing Provider  Calcium Carbonate-Vitamin D (CALTRATE 600+D) 600-400 MG-UNIT per tablet Take 2 tablets by mouth daily.    Yes Historical Provider, MD  Cholecalciferol (VITAMIN D3) 2000 UNITS TABS Take 1 tablet by mouth  daily.   Yes Historical Provider, MD  ibuprofen (ADVIL,MOTRIN) 200 MG tablet Take 200 mg by mouth every 6 (six) hours as needed.   Yes Historical Provider, MD  levothyroxine (SYNTHROID, LEVOTHROID) 88 MCG tablet Take 88 mcg by mouth daily before breakfast.   Yes Historical Provider, MD  LYRICA 100 MG capsule TAKE ONE CAPSULE BY MOUTH THREE TIMES DAILY 07/14/16  Yes Kathrynn Ducking, MD  phenytoin (DILANTIN) 100 MG ER capsule Take 2 capsules (200 mg total) by mouth at bedtime. 07/26/16  Yes Kathrynn Ducking, MD  pregabalin (LYRICA) 50 MG capsule Take 1 capsule (50 mg total) by mouth 3 (three) times daily. 07/21/16  Yes Kathrynn Ducking, MD    ROS:  Out of a complete 14 system review of symptoms, the patient complains only of the following symptoms, and all other reviewed systems are negative.  Blurred vision Constipation Muscle cramps  Blood pressure (!) 144/86, pulse 83, height 5' (1.524 m), weight 114 lb (51.7 kg).  Physical Exam  General: The patient is alert and cooperative at the time of the examination.  Skin: No significant peripheral edema is noted.   Neurologic Exam  Mental status: The patient is alert and oriented x 3 at the time of the examination. The patient has apparent normal recent and remote memory, with an apparently normal attention span  and concentration ability.   Cranial nerves: Facial symmetry is present. Speech is normal, no aphasia or dysarthria is noted. Extraocular movements are full. Visual fields are full.  Motor: The patient has good strength in all 4 extremities.  Sensory examination: Soft touch sensation is symmetric on the face, arms, and legs.  Coordination: The patient has good finger-nose-finger and heel-to-shin bilaterally.  Gait and station: The patient has a slightly wide-based, unsteady gait. Romberg is negative. No drift is seen.  Reflexes: Deep tendon reflexes are symmetric.   Assessment/Plan:  1. Trigeminal neuralgia, right V2  distribution  The patient has had a significant exacerbation of her trigeminal neuralgia. She is on a drug regimen that appears to be working, but she is now oversedated and staggery on her medication. The patient will cut back on the Lyrica taking the 50 mg capsules twice during the day instead of 3 times daily. If she does well for a week, we can drop off another 50 mg capsule. Eventually we may be a will to stop the Dilantin as well. The patient will follow-up in 2 months.   Yolanda Alexanders MD 08/05/2016 11:52 AM  [facility] [address] [place], [place] [address]  Phone [phone] Fax [phone]

## 2016-08-09 ENCOUNTER — Telehealth: Payer: Self-pay | Admitting: Neurology

## 2016-08-09 NOTE — Telephone Encounter (Signed)
I called the patient. The plan with the Lyrica is to try to slowly taper off to get her back to the 100 mg 3 time a day dosing. We will hold off on the 150 mg capsule prescription for now. We will stay on Dilantin. In another week, she is to drop off of the 50 mg capsule in the morning. She is to let me know if the pain returns.

## 2016-08-09 NOTE — Telephone Encounter (Signed)
Patient is calling to get a new Rx for Lyrica 150mg  #60 called to [facility] on [address]. This mg would be cheaper for the patient since she takes 2 a day.

## 2016-08-12 ENCOUNTER — Telehealth: Payer: Self-pay | Admitting: Neurology

## 2016-08-12 DIAGNOSIS — R432 Parageusia: Secondary | ICD-10-CM

## 2016-08-12 DIAGNOSIS — R6884 Jaw pain: Secondary | ICD-10-CM

## 2016-08-12 MED ORDER — DEXAMETHASONE 2 MG PO TABS: ORAL_TABLET | ORAL | 0 refills | Status: DC

## 2016-08-12 MED ORDER — PHENYTOIN SODIUM EXTENDED 100 MG PO CAPS: 300.0000 mg | ORAL_CAPSULE | Freq: Every day | ORAL | 1 refills | Status: DC

## 2016-08-12 NOTE — Telephone Encounter (Signed)
I called the patient, talk with husband. The patient began having trigeminal neuralgia again last evening. We will go up to 300 mg of Dilantin, back to 150 mg 3 times daily of Lyrica, and I will give a three-day course of Decadron. If the pain does not abate, they are to contact me. We may need to check Dilantin levels in about 2 or 3 weeks.

## 2016-08-12 NOTE — Telephone Encounter (Signed)
Pt's husband called in stating pt had an episode of Trigeminal Neuralgia last night. She is having a hard time eating, drink, and speaking. Also is experiencing cramping in feet. Husband is concerned and would like a call back. Please call [phone]

## 2016-08-20 MED ORDER — ALPRAZOLAM 0.5 MG PO TABS: ORAL_TABLET | ORAL | 0 refills | Status: DC

## 2016-08-20 NOTE — Telephone Encounter (Signed)
Patient called to advise trigeminal neuralgia pain has stopped, new strange issue of Left jaw and ear very painful, seems to taper off during the day, right before going to bed takes Dilantin, when she wakes up in the morning it's very painful, sleeps during the night w/no pain of any kind, also states drinking water taste metallic and bitter.

## 2016-08-20 NOTE — Addendum Note (Signed)
Addended by: Margette Fast on: 08/20/2016 01:36 PM   Modules accepted: Orders

## 2016-08-20 NOTE — Telephone Encounter (Signed)
I called the patient. The patient has had new symptoms associated with pain in the left jaw, her trigeminal neuralgias on the right. This is worse in the morning, seems to improve as the day goes on. The patient has also noted some alteration in taste, she has not noted any facial weakness. This has been present for 3-4 days. Etiology of these new symptoms is not clear. I will set patient up for MRI evaluation of the brain. The patient will need blood work for a Dilantin level, comprehensive metabolic profile soon.

## 2016-08-22 ENCOUNTER — Emergency Department (HOSPITAL_COMMUNITY)
Admission: EM | Admit: 2016-08-22 | Discharge: 2016-08-23 | Disposition: A | Discharge status: Home / Self Care | Payer: Medicare Other | Attending: Emergency Medicine | Admitting: Emergency Medicine

## 2016-08-22 ENCOUNTER — Encounter (HOSPITAL_COMMUNITY): Payer: Self-pay | Admitting: *Deleted

## 2016-08-22 DIAGNOSIS — G500 Trigeminal neuralgia: Secondary | ICD-10-CM | POA: Diagnosis not present

## 2016-08-22 DIAGNOSIS — R42 Dizziness and giddiness: Secondary | ICD-10-CM | POA: Diagnosis present

## 2016-08-22 DIAGNOSIS — E039 Hypothyroidism, unspecified: Secondary | ICD-10-CM | POA: Diagnosis not present

## 2016-08-22 DIAGNOSIS — Z79899 Other long term (current) drug therapy: Secondary | ICD-10-CM | POA: Diagnosis not present

## 2016-08-22 LAB — CBC WITH DIFFERENTIAL/PLATELET
BASOS PCT: 1 %
Basophils Absolute: 0.1 10*3/uL (ref 0.0–0.1)
EOS ABS: 0.1 10*3/uL (ref 0.0–0.7)
Eosinophils Relative: 1 %
HCT: 39.1 % (ref 36.0–46.0)
HEMOGLOBIN: 13.2 g/dL (ref 12.0–15.0)
LYMPHS ABS: 1 10*3/uL (ref 0.7–4.0)
Lymphocytes Relative: 16 %
MCH: 29.1 pg (ref 26.0–34.0)
MCHC: 33.8 g/dL (ref 30.0–36.0)
MCV: 86.1 fL (ref 78.0–100.0)
MONOS PCT: 10 %
Monocytes Absolute: 0.6 10*3/uL (ref 0.1–1.0)
NEUTROS ABS: 4.4 10*3/uL (ref 1.7–7.7)
Neutrophils Relative %: 72 %
Platelets: 240 10*3/uL (ref 150–400)
RBC: 4.54 MIL/uL (ref 3.87–5.11)
RDW: 13.7 % (ref 11.5–15.5)
WBC: 6.1 10*3/uL (ref 4.0–10.5)

## 2016-08-22 LAB — COMPREHENSIVE METABOLIC PANEL
ALBUMIN: 3.6 g/dL (ref 3.5–5.0)
ALK PHOS: 128 U/L — AB (ref 38–126)
ALT: 27 U/L (ref 14–54)
AST: 26 U/L (ref 15–41)
Anion gap: 9 (ref 5–15)
BUN: 7 mg/dL (ref 6–20)
CALCIUM: 8.6 mg/dL — AB (ref 8.9–10.3)
CO2: 25 mmol/L (ref 22–32)
CREATININE: 0.53 mg/dL (ref 0.44–1.00)
Chloride: 96 mmol/L — ABNORMAL LOW (ref 101–111)
GFR calc Af Amer: >60 mL/min (ref >60)
GFR calc non Af Amer: >60 mL/min (ref >60)
GLUCOSE: 129 mg/dL — AB (ref 65–99)
Potassium: 4.3 mmol/L (ref 3.5–5.1)
SODIUM: 130 mmol/L — AB (ref 135–145)
TOTAL PROTEIN: 6.6 g/dL (ref 6.5–8.1)
Total Bilirubin: 0.4 mg/dL (ref 0.3–1.2)

## 2016-08-22 LAB — I-STAT TROPONIN, ED: Troponin i, poc: 0.01 ng/mL (ref 0.00–0.08)

## 2016-08-22 MED ORDER — SODIUM CHLORIDE 0.9 % IV BOLUS (SEPSIS)
1000.0000 mL | Freq: Once | INTRAVENOUS | Status: AC
  Administered 2016-08-22: 1000 mL via INTRAVENOUS

## 2016-08-22 NOTE — ED Triage Notes (Signed)
Patient presents stating her been nauseated and dizzy since trigeminal neuralgia meds were increased about 1 month ago  Also states her right ear and down to her jaw has been hurting

## 2016-08-22 NOTE — ED Notes (Signed)
MRI scheduled for Oct 5

## 2016-08-22 NOTE — ED Provider Notes (Signed)
[facility] DEPT Provider Note   CSN: [id number] Arrival date & time: 08/22/16  2147   By signing my name below, I, Evelene Croon, attest that this documentation has been prepared under the direction and in the presence of Merryl Hacker, MD . Electronically Signed: Evelene Croon, Scribe. 08/22/2016. 11:44 PM.   History   Chief Complaint Chief Complaint  Patient presents with  . Dizziness  . Nausea    The history is provided by the patient. No language interpreter was used.     HPI Comments:  Tannie Eichmann is a 75 y.o. female who presents to the Emergency Department complaining of gradually worsening generalized weakness. She reports associated room spinning dizziness and nausea. Pt notes she has been having these episodes since July 13, 2016 and states it is due to her Trigeminal neuralgia. Pt saw her neurologist after her first episode and had her dose of Lyrica increased which provided mild relief for a few days. She was then started on prednisone with moderate improvement. In the last 2-3 weeks she has also had a  second episode of trigeminal neuralgia and was also started on Dilantin. She states her neurologist has scheduled her to have an MRI in the next few weeks. Pt also notes decreased appetite and states whatever she is able to ingest, often in the form of a smoothie, "runs" through her. She is also complaining of right ear pain x a few days, worse when she opens her mouth and an area of redness that she noticed this evening to right cheek. She denies abdominal pain and fever.   Jannifer Franklin- Neurologist   Past Medical History:  Diagnosis Date  . Hypothyroidism   . Osteopenia   . Trigeminal neuralgia    Right V2 distribution  . Vitamin D deficiency     Patient Active Problem List   Diagnosis Date Noted  . Trigeminal neuralgia 05/09/2014  . 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 APPENDECTOMY    . CATARACT  EXTRACTION Bilateral   . gamma knife     procedure  . TONSILLECTOMY    . TUBAL LIGATION Bilateral     OB History    No data available       Home Medications    Prior to Admission medications   Medication Sig Start Date End Date Taking? Authorizing Provider  acetaminophen (TYLENOL) 500 MG tablet Take 1,000 mg by mouth every 6 (six) hours as needed for mild pain.   Yes Historical Provider, MD  Calcium Carbonate-Vitamin D (CALTRATE 600+D) 600-400 MG-UNIT per tablet Take 2 tablets by mouth daily.    Yes Historical Provider, MD  Cholecalciferol (VITAMIN D3) 2000 UNITS TABS Take 1 tablet by mouth daily.   Yes Historical Provider, MD  levothyroxine (SYNTHROID, LEVOTHROID) 88 MCG tablet Take 88 mcg by mouth daily before breakfast.   Yes Historical Provider, MD  LYRICA 100 MG capsule TAKE ONE CAPSULE BY MOUTH THREE TIMES DAILY 07/14/16  Yes Kathrynn Ducking, MD  phenytoin (DILANTIN) 100 MG ER capsule Take 3 capsules (300 mg total) by mouth at bedtime. 08/12/16  Yes Kathrynn Ducking, MD  pregabalin (LYRICA) 50 MG capsule Take 1 capsule (50 mg total) by mouth 3 (three) times daily. Patient taking differently: Take 50 mg by mouth 3 (three) times daily. Take with 100 mg Lyrica to equal 150 mg 07/21/16  Yes Kathrynn Ducking, MD  ALPRAZolam Duanne Moron) 0.5 MG tablet Take 2 tablets approximately 45  minutes prior to the MRI study, take a third tablet if needed. 08/20/16   Kathrynn Ducking, MD  dexamethasone (DECADRON) 2 MG tablet Begin 3 tablets the first day, 2 tablets the next and 1 tablet the third day Patient not taking: Reported on 08/22/2016 08/12/16   Kathrynn Ducking, MD    Family History Family History  Problem Relation Age of Onset  . Pneumonia Mother   . Diabetes Sister     Social History Social History  Substance Use Topics  . Smoking status: Never Smoker  . Smokeless tobacco: Never Used  . Alcohol use No     Allergies   Amoxicillin; Codeine; Trileptal [oxcarbazepine]; and Ultram  [tramadol]   Review of Systems Review of Systems  Constitutional: Positive for appetite change. Negative for fever.  HENT: Positive for ear pain.   Respiratory: Negative for shortness of breath.   Cardiovascular: Negative for chest pain.  Gastrointestinal: Positive for nausea. Negative for abdominal pain and vomiting.  Skin: Positive for color change (facial erythema).  Neurological: Positive for dizziness and weakness (generalized).  All other systems reviewed and are negative.    Physical Exam Updated Vital Signs BP (!) 171/113   Pulse 99   Temp 99.6 F (37.6 C) (Oral)   Resp 20   Ht 5' (1.524 m)   Wt 114 lb (51.7 kg)   SpO2 98%   BMI 22.26 kg/m   Physical Exam  Constitutional: She is oriented to person, place, and time. She appears well-developed. No distress.  HENT:  Head: Normocephalic and atraumatic.  Mouth/Throat: Oropharynx is clear and moist.  Mucous membranes dry, bilateral TMs clear, no obvious rash or vesicular lesions noted on the face  Eyes: Pupils are equal, round, and reactive to light.  Neck: Normal range of motion. Neck supple.  Cardiovascular: Normal rate, regular rhythm and normal heart sounds.   No murmur heard. Pulmonary/Chest: Effort normal and breath sounds normal. No respiratory distress. She has no wheezes.  Abdominal: Soft. Bowel sounds are normal. There is no tenderness. There is no guarding.  Neurological: She is alert and oriented to person, place, and time.  Cranial nerves II through XII intact, 5 out of 5 strength in all 4 extremities, no dysmetria to finger-nose-finger  Skin: Skin is warm and dry.  Psychiatric: She has a normal mood and affect.  Nursing note and vitals reviewed.    ED Treatments / Results  DIAGNOSTIC STUDIES:  Oxygen Saturation is 97% on RA, normal by my interpretation.    COORDINATION OF CARE:  11:44 PM Discussed treatment plan with pt at bedside and pt agreed to plan.  Labs (all labs ordered are listed, but  only abnormal results are displayed) Labs Reviewed  COMPREHENSIVE METABOLIC PANEL - Abnormal; Notable for the following:       Result Value   Sodium 130 (*)    Chloride 96 (*)    Glucose, Bld 129 (*)    Calcium 8.6 (*)    Alkaline Phosphatase 128 (*)    All other components within normal limits  PHENYTOIN LEVEL, TOTAL - Abnormal; Notable for the following:    Phenytoin Lvl 9.0 (*)    All other components within normal limits  URINALYSIS, ROUTINE W REFLEX MICROSCOPIC (NOT AT [facility]) - Abnormal; Notable for the following:    Color, Urine STRAW (*)    Specific Gravity, Urine <1.005 (*)    Leukocytes, UA TRACE (*)    All other components within normal limits  URINE MICROSCOPIC-ADD ON -  Abnormal; Notable for the following:    Squamous Epithelial / LPF 0-5 (*)    Bacteria, UA RARE (*)    All other components within normal limits  CBC WITH DIFFERENTIAL/PLATELET  Randolm Idol, ED    EKG  EKG Interpretation  Date/Time:  Sunday August 22 2016 21:54:53 EDT Ventricular Rate:  91 PR Interval:  156 QRS Duration: 70 QT Interval:  332 QTC Calculation: 408 R Axis:   78 Text Interpretation:  Normal sinus rhythm Possible Left atrial enlargement Cannot rule out Anterior infarct , age undetermined Abnormal ECG Confirmed by Dina Rich  MD, [place] ([id number]) on 08/22/2016 11:04:05 PM       Radiology Mr Brain W Or Wo Contrast  Result Date: 08/23/2016 CLINICAL DATA:  Generalized weakness, dizziness and nausea beginning July 13, 2016, attributed to trigeminal neuralgia. EXAM: MRI HEAD WITHOUT AND WITH CONTRAST TECHNIQUE: Multiplanar, multiecho pulse sequences of the brain and surrounding structures were obtained without and with intravenous contrast. CONTRAST:  36mL MULTIHANCE GADOBENATE DIMEGLUMINE 529 MG/ML IV SOLN COMPARISON:  Cervical spine radiographs November 13, 2014 FINDINGS: INTRACRANIAL CONTENTS: No reduced diffusion to suggest acute ischemia. No susceptibility artifact to suggest  hemorrhage. The ventricles and sulci are normal for patient's age. A few scattered subcentimeter supratentorial white matter FLAIR T2 hyperintensities are less than expected for age most compatible with chronic small vessel ischemic disease. No suspicious parenchymal signal, mass lesions, mass effect. No abnormal intraparenchymal or extra-axial enhancement. No abnormal extra-axial fluid collections. No extra-axial masses. Normal major intracranial vascular flow voids present at skull base. ORBITS: The included ocular globes and orbital contents are non-suspicious. Status post bilateral ocular lens implants. SINUSES: The mastoid air-cells and included paranasal sinuses are well-aerated. SKULL/SOFT TISSUES: No abnormal sellar expansion. No suspicious calvarial bone marrow signal. Craniocervical junction maintained. Multilevel severe degenerative discs. IMPRESSION: Negative MRI head with and without contrast for age. Electronically Signed   By: Elon Alas M.D.   On: 08/23/2016 05:14    Procedures Procedures (including critical care time)  Medications Ordered in ED Medications  sodium chloride 0.9 % bolus 1,000 mL (1,000 mLs Intravenous New Bag/Given 08/22/16 2359)  meclizine (ANTIVERT) tablet 25 mg (25 mg Oral Given 08/23/16 0057)  acetaminophen (TYLENOL) tablet 650 mg (650 mg Oral Given 08/23/16 0057)  diazepam (VALIUM) tablet 2 mg (2 mg Oral Given 08/23/16 0142)     Initial Impression / Assessment and Plan / ED Course  I have reviewed the triage vital signs and the nursing notes.  Pertinent labs & imaging results that were available during my care of the patient were reviewed by me and considered in my medical decision making (see chart for details).  Clinical Course  Comment By Time  Patient ambulate in hallway. Very unsteady gait. No specific ataxia; however, nursing felt patient was a fall risk. Will obtain MRI. Merryl Hacker, MD 09/25 [phone]    Patient presents with persistent  dizziness and increasing pain both she relates to trigeminal neuralgia and treatment. She is nontoxic. Not orthostatic. No focal deficits. Vital signs reassuring. She does describe room spinning dizziness. Temporal relationship with increase in Lyrica. She was given fluids and Antivert. Lab work obtained and largely reassuring. Upon ambulation she had a very unsteady gait. She states that over the last month she has used her husband to steady her. MRI obtained to rule out posterior circulation stroke. This is negative. On reassessment, patient reports some improvement of symptoms with hydration. Given that she has had similar gait issues for the  last month, feel she is safe for discharge home. I have encouraged very close follow-up with her neurologist. Patient and husband stated understanding.  After history, exam, and medical workup I feel the patient has been appropriately medically screened and is safe for discharge home. Pertinent diagnoses were discussed with the patient. Patient was given return precautions.   Final Clinical Impressions(s) / ED Diagnoses   Final diagnoses:  Dizziness  Trigeminal neuralgia    New Prescriptions New Prescriptions   No medications on file   I personally performed the services described in this documentation, which was scribed in my presence. The recorded information has been reviewed and is accurate.      Merryl Hacker, MD 08/24/16 [phone]

## 2016-08-23 ENCOUNTER — Telehealth: Payer: Self-pay | Admitting: Neurology

## 2016-08-23 ENCOUNTER — Emergency Department (HOSPITAL_COMMUNITY): Payer: Medicare Other

## 2016-08-23 LAB — URINE MICROSCOPIC-ADD ON

## 2016-08-23 LAB — URINALYSIS, ROUTINE W REFLEX MICROSCOPIC
Bilirubin Urine: NEGATIVE
GLUCOSE, UA: NEGATIVE mg/dL
HGB URINE DIPSTICK: NEGATIVE
Ketones, ur: NEGATIVE mg/dL
Nitrite: NEGATIVE
Protein, ur: NEGATIVE mg/dL
pH: 7 (ref 5.0–8.0)

## 2016-08-23 LAB — PHENYTOIN LEVEL, TOTAL: Phenytoin Lvl: 9 ug/mL — ABNORMAL LOW (ref 10.0–20.0)

## 2016-08-23 MED ORDER — LORAZEPAM 2 MG/ML IJ SOLN
1.0000 mg | Freq: Once | INTRAMUSCULAR | Status: AC
  Administered 2016-08-23: 1 mg via INTRAVENOUS
  Filled 2016-08-23: qty 1

## 2016-08-23 MED ORDER — MECLIZINE HCL 25 MG PO TABS
25.0000 mg | ORAL_TABLET | Freq: Once | ORAL | Status: AC
  Administered 2016-08-23: 25 mg via ORAL
  Filled 2016-08-23: qty 1

## 2016-08-23 MED ORDER — GADOBENATE DIMEGLUMINE 529 MG/ML IV SOLN
10.0000 mL | Freq: Once | INTRAVENOUS | Status: AC | PRN
  Administered 2016-08-23: 10 mL via INTRAVENOUS

## 2016-08-23 MED ORDER — DIAZEPAM 2 MG PO TABS
2.0000 mg | ORAL_TABLET | Freq: Once | ORAL | Status: AC
  Administered 2016-08-23: 2 mg via ORAL
  Filled 2016-08-23: qty 1

## 2016-08-23 MED ORDER — DIAZEPAM 5 MG PO TABS: 5.0000 mg | ORAL_TABLET | Freq: Once | ORAL | Status: DC

## 2016-08-23 MED ORDER — ACETAMINOPHEN 325 MG PO TABS
650.0000 mg | ORAL_TABLET | Freq: Once | ORAL | Status: AC
  Administered 2016-08-23: 650 mg via ORAL
  Filled 2016-08-23: qty 2

## 2016-08-23 NOTE — Telephone Encounter (Signed)
Spoke to pt's husband. Reports that she was seen in clinic yesterday afternoon d/t jaw pain on the left. Was "clinically negative for dehydration and instructed to take Tylenol 2 tablets 4 times a day." Later last night, the pain got worse and pt went to the ER for additional tests, medications and had MRI. ER doc mentioned possible TMJ. Pt is currently sleeping. Work-in appt scheduled for next Tues @ 7:30. Pt/family will call back w/  worsening symptoms or additional questions.

## 2016-08-23 NOTE — ED Notes (Signed)
Pt tolerated PO without any problem.

## 2016-08-23 NOTE — ED Notes (Signed)
Ambulated pt with Tramaine EMT, pt very unsteady & reported dizziness. EDP aware

## 2016-08-23 NOTE — Telephone Encounter (Signed)
Pt's husband called in stating pt was seen in the hospital last night for trigeminal Neuralgia. He would like to make a f/ u appt for as soon as possible. He states she has not taken any solid food since August. He is extremely worried and would like a call back as soon as possible to schedule an appt.   Anderson Malta- I was not sure if she would be appropriate for the 15 min f/u considering her hx.

## 2016-08-23 NOTE — Discharge Instructions (Signed)
You were seen today for ongoing symptoms of dizziness and trigeminal neuralgia pain. Your workup is largely reassuring. You need to follow-up very closely with your neurologist for medication adjustment as you relate your symptoms to medication use. You should not drive or operate heavy machinery.

## 2016-08-25 NOTE — Telephone Encounter (Signed)
I called the patient, talk with the husband. Since being on Dilantin the patient is felt listless, the neuralgia pain is better, however. We may need to cut back on the Dilantin going to 200 mg daily for 3 days, 100 mg for 3 days, then stop. If the neuralgia pain comes back, we will need to address this. The MRI that we ordered of the brain will need to be canceled, the patient had MRI of the brain in the emergency room, this was unremarkable.

## 2016-08-25 NOTE — Telephone Encounter (Signed)
Pt's husband has called in stating his wife is not getting better and they are wondering if the medication phenytoin (DILANTIN) 100 MG ER capsule is making her feel bad. Should she stop taking it and continue with the LYRICA 100 MG capsule. Pt's husband has strongly expressed his concern with not seeing any positive results. Please call [phone]

## 2016-08-26 NOTE — Telephone Encounter (Signed)
Noted  

## 2016-08-31 ENCOUNTER — Ambulatory Visit (INDEPENDENT_AMBULATORY_CARE_PROVIDER_SITE_OTHER): Payer: Medicare Other | Admitting: Neurology

## 2016-08-31 ENCOUNTER — Encounter: Payer: Self-pay | Admitting: Neurology

## 2016-08-31 VITALS — BP 134/85 | HR 70 | Resp 20 | Ht 60.0 in | Wt 115.0 lb

## 2016-08-31 DIAGNOSIS — R29898 Other symptoms and signs involving the musculoskeletal system: Secondary | ICD-10-CM | POA: Diagnosis not present

## 2016-08-31 DIAGNOSIS — G500 Trigeminal neuralgia: Secondary | ICD-10-CM

## 2016-08-31 MED ORDER — PREGABALIN 50 MG PO CAPS: 50.0000 mg | ORAL_CAPSULE | Freq: Three times a day (TID) | ORAL | 3 refills | Status: DC

## 2016-08-31 NOTE — Patient Instructions (Signed)
   With the Lyrica 50 mg capsule, stop the midday dose.

## 2016-08-31 NOTE — Progress Notes (Signed)
Reason for visit: Trigeminal neuralgia  Yolanda Garrett is an 75 y.o. female  History of present illness:  Yolanda Garrett is a 75 year old right-handed white female with a history of trigeminal neuralgia in the right V2 distribution. The patient has gone 2 weeks without any pain whatsoever. She is still eating soft foods, she is tenuous about getting back to a regular diet. The patient is on Lyrica taking 150 mg 3 times daily, she could not tolerate the Dilantin as this resulted in muscle spasms in the jaws bilaterally and in the low back. The patient also reports a one-year history of numbness and weakness in the right arm. She denies any pain, she indicates that she had MRI evaluation of the cervical spine done through her orthopedic surgeon, the results are not available to me. She was told that she may have a pinched nerve in the neck. The patient returns to this office for an evaluation. She is still having problems feeling spacey and sleepy on the current dose of Lyrica, she has not had any falls.  Past Medical History:  Diagnosis Date  . Hypothyroidism   . Osteopenia   . Trigeminal neuralgia    Right V2 distribution  . Vitamin D deficiency     Past Surgical History:  Procedure Laterality Date  . APPENDECTOMY    . CATARACT EXTRACTION Bilateral   . gamma knife     procedure  . TONSILLECTOMY    . TUBAL LIGATION Bilateral     Family History  Problem Relation Age of Onset  . Pneumonia Mother   . Diabetes Sister     Social history:  reports that she has never smoked. She has never used smokeless tobacco. She reports that she does not drink alcohol or use drugs.    Allergies  Allergen Reactions  . Amoxicillin   . Codeine   . Trileptal [Oxcarbazepine]   . Ultram [Tramadol]     Medications:  Prior to Admission medications   Medication Sig Start Date End Date Taking? Authorizing Provider  acetaminophen (TYLENOL) 500 MG tablet Take 1,000 mg by mouth every 6 (six) hours as  needed for mild pain.   Yes Historical Provider, MD  ALPRAZolam Duanne Moron) 0.5 MG tablet Take 2 tablets approximately 45 minutes prior to the MRI study, take a third tablet if needed. 08/20/16  Yes Kathrynn Ducking, MD  Calcium Carbonate-Vitamin D (CALTRATE 600+D) 600-400 MG-UNIT per tablet Take 2 tablets by mouth daily.    Yes Historical Provider, MD  Cholecalciferol (VITAMIN D3) 2000 UNITS TABS Take 1 tablet by mouth daily.   Yes Historical Provider, MD  dexamethasone (DECADRON) 2 MG tablet Begin 3 tablets the first day, 2 tablets the next and 1 tablet the third day 08/12/16  Yes Kathrynn Ducking, MD  levothyroxine (SYNTHROID, LEVOTHROID) 88 MCG tablet Take 88 mcg by mouth daily before breakfast.   Yes Historical Provider, MD  LYRICA 100 MG capsule TAKE ONE CAPSULE BY MOUTH THREE TIMES DAILY 07/14/16  Yes Kathrynn Ducking, MD  pregabalin (LYRICA) 50 MG capsule Take 1 capsule (50 mg total) by mouth 3 (three) times daily. Patient taking differently: Take 50 mg by mouth 3 (three) times daily. Take with 100 mg Lyrica to equal 150 mg 07/21/16  Yes Kathrynn Ducking, MD  phenytoin (DILANTIN) 100 MG ER capsule Take 3 capsules (300 mg total) by mouth at bedtime. Patient not taking: Reported on 08/31/2016 08/12/16   Kathrynn Ducking, MD    ROS:  Out of a complete 14 system review of symptoms, the patient complains only of the following symptoms, and all other reviewed systems are negative.  Right arm weakness  Blood pressure 134/85, pulse 70, resp. rate 20, height 5' (1.524 m), weight 115 lb (52.2 kg).  Physical Exam  General: The patient is alert and cooperative at the time of the examination.  Skin: No significant peripheral edema is noted.   Neurologic Exam  Mental status: The patient is alert and oriented x 3 at the time of the examination. The patient has apparent normal recent and remote memory, with an apparently normal attention span and concentration ability.   Cranial nerves: Facial  symmetry is present. Speech is normal, no aphasia or dysarthria is noted. Extraocular movements are full. Visual fields are full.  Motor: The patient has good strength in all 4 extremities.  Sensory examination: Soft touch sensation is symmetric on the face, arms, and legs.  Coordination: The patient has good finger-nose-finger and heel-to-shin bilaterally.  Gait and station: The patient has a normal gait. Tandem gait is normal. Romberg is negative. No drift is seen.  Reflexes: Deep tendon reflexes are symmetric.   MRI brain 08/23/16:  IMPRESSION: Negative MRI head with and without contrast for age.  * MRI scan images were reviewed online. I agree with the written report.    Assessment/Plan:  1. Trigeminal neuralgia, right V2 distribution  2. Reported right arm weakness or clumsiness  MRI of the brain done recently was unremarkable. The patient reports a one-year history of problems with right arm weakness, no true weakness is seen on the clinical examination. The patient will be set up for nerve conduction studies on both arms, EMG on the right arm. The Lyrica dose will be reduced taking 150 mg in the morning and evening, 100 mg at midday. They will keep this dose regimen for at least 2 weeks, we may consider reducing the dose further if she continues to do well. A prescription was written for the Lyrica 50 mg capsules. She will follow-up in 3 months, and follow-up for the EMG evaluation. The patient has had gamma knife procedure on the right for the trigeminal neuralgia in the past.  C. Floyde Parkins MD 08/31/2016 7:28 AM  [facility] [address] [place], [place] [address]  Phone [phone] Fax [phone]

## 2016-09-01 ENCOUNTER — Encounter: Payer: Self-pay | Admitting: Neurology

## 2016-09-01 ENCOUNTER — Ambulatory Visit (INDEPENDENT_AMBULATORY_CARE_PROVIDER_SITE_OTHER): Payer: Self-pay | Admitting: Neurology

## 2016-09-01 ENCOUNTER — Ambulatory Visit (INDEPENDENT_AMBULATORY_CARE_PROVIDER_SITE_OTHER): Payer: Medicare Other | Admitting: Neurology

## 2016-09-01 DIAGNOSIS — R29898 Other symptoms and signs involving the musculoskeletal system: Secondary | ICD-10-CM

## 2016-09-01 DIAGNOSIS — G500 Trigeminal neuralgia: Secondary | ICD-10-CM

## 2016-09-01 DIAGNOSIS — Z0289 Encounter for other administrative examinations: Secondary | ICD-10-CM

## 2016-09-01 NOTE — Procedures (Signed)
     HISTORY:  Yolanda Garrett is a 75 year old patient with a 1-2 year history of what she perceives as weakness of the right arm. The patient also will occasionally drag the right leg. She denies any discomfort in the neck or down the right arm. The patient is being evaluated for a possible neuropathy or a radiculopathy.  NERVE CONDUCTION STUDIES:  Nerve conduction studies were performed on both upper extremities. The distal motor latencies and motor amplitudes for the median and ulnar nerves were within normal limits. The F wave latencies and nerve conduction velocities for these nerves were also normal. The sensory latencies for the median and ulnar nerves were normal.   EMG STUDIES:  EMG study was performed on the right upper extremity:  The first dorsal interosseous muscle reveals 2 to 4 K units with full recruitment. No fibrillations or positive waves were noted. The abductor pollicis brevis muscle reveals 2 to 4 K units with full recruitment. No fibrillations or positive waves were noted. The extensor indicis proprius muscle reveals 1 to 3 K units with full recruitment. No fibrillations or positive waves were noted. The pronator teres muscle reveals 2 to 3 K units with full recruitment. No fibrillations or positive waves were noted. The biceps muscle reveals 1 to 2 K units with full recruitment. No fibrillations or positive waves were noted. The triceps muscle reveals 2 to 4 K units with full recruitment. No fibrillations or positive waves were noted. The anterior deltoid muscle reveals 2 to 3 K units with full recruitment. No fibrillations or positive waves were noted. The cervical paraspinal muscles were tested at 2 levels. No abnormalities of insertional activity were seen at either level tested. There was good relaxation.   IMPRESSION:  Nerve conduction studies done on both upper extremities were within normal limits. EMG evaluation of the right upper extremity was unremarkable,  without evidence of an overlying cervical radiculopathy.  Jill Alexanders MD 09/01/2016 1:53 PM  [facility] [address] [place], [place] [address]  Phone [phone] Fax [phone]

## 2016-09-01 NOTE — Progress Notes (Signed)
EMG and nerve conduction study done today was unremarkable. The patient reports a 1-2 year history of problems with use of the right arm. She occasionally may drag the right leg.  Examination today does show some slowness of movement, clumsiness with using the right hand. MRI of the brain was normal. The patient will need to be followed for a developing neurodegenerative process such as Parkinson's disease. She does have some masking of the face, but this has been attributed to the side effects of the Lyrica as she feels "zoned out" on the current dose.

## 2016-09-01 NOTE — Progress Notes (Signed)
Please refer to EMG and NCV evaluation. 

## 2016-09-02 ENCOUNTER — Other Ambulatory Visit: Payer: Medicare Other

## 2016-09-02 ENCOUNTER — Other Ambulatory Visit: Payer: Self-pay | Admitting: Family Medicine

## 2016-09-02 DIAGNOSIS — Z1231 Encounter for screening mammogram for malignant neoplasm of breast: Secondary | ICD-10-CM

## 2016-09-06 ENCOUNTER — Ambulatory Visit
Admission: RE | Admit: 2016-09-06 | Discharge: 2016-09-06 | Disposition: A | Discharge status: Home / Self Care | Payer: Medicare Other | Source: Ambulatory Visit | Attending: Family Medicine | Admitting: Family Medicine

## 2016-09-06 DIAGNOSIS — Z1231 Encounter for screening mammogram for malignant neoplasm of breast: Secondary | ICD-10-CM

## 2016-10-04 ENCOUNTER — Ambulatory Visit: Payer: Medicare Other | Admitting: Adult Health

## 2016-10-17 ENCOUNTER — Encounter: Payer: Self-pay | Admitting: Neurology

## 2016-10-18 ENCOUNTER — Other Ambulatory Visit: Payer: Self-pay | Admitting: Neurology

## 2016-10-18 MED ORDER — PREGABALIN 150 MG PO CAPS: 150.0000 mg | ORAL_CAPSULE | Freq: Three times a day (TID) | ORAL | 1 refills | Status: DC

## 2016-10-18 NOTE — Progress Notes (Signed)
Rx printed, signed, faxed to pharmacy. 

## 2016-11-01 ENCOUNTER — Telehealth: Payer: Self-pay | Admitting: Neurology

## 2016-11-01 ENCOUNTER — Encounter: Payer: Self-pay | Admitting: Neurology

## 2016-11-01 DIAGNOSIS — G500 Trigeminal neuralgia: Secondary | ICD-10-CM

## 2016-11-01 NOTE — Telephone Encounter (Signed)
I called patient. The patient still having ongoing discomfort, she has had gamma knife procedure in [place], [place] previously.  I will get her set up for [facility] for the gamma knife procedure.

## 2016-11-13 ENCOUNTER — Encounter: Payer: Self-pay | Admitting: Neurology

## 2016-11-17 ENCOUNTER — Encounter: Payer: Self-pay | Admitting: Neurology

## 2016-11-29 ENCOUNTER — Encounter (HOSPITAL_COMMUNITY): Payer: Self-pay

## 2016-11-29 ENCOUNTER — Encounter: Payer: Self-pay | Admitting: Neurology

## 2016-11-29 ENCOUNTER — Emergency Department (HOSPITAL_COMMUNITY)
Admission: EM | Admit: 2016-11-29 | Discharge: 2016-11-29 | Disposition: A | Discharge status: Home / Self Care | Payer: Medicare Other | Attending: Emergency Medicine | Admitting: Emergency Medicine

## 2016-11-29 DIAGNOSIS — Z79899 Other long term (current) drug therapy: Secondary | ICD-10-CM | POA: Insufficient documentation

## 2016-11-29 DIAGNOSIS — G500 Trigeminal neuralgia: Secondary | ICD-10-CM | POA: Diagnosis not present

## 2016-11-29 DIAGNOSIS — R51 Headache: Secondary | ICD-10-CM | POA: Diagnosis present

## 2016-11-29 DIAGNOSIS — E039 Hypothyroidism, unspecified: Secondary | ICD-10-CM | POA: Diagnosis not present

## 2016-11-29 MED ORDER — TOPIRAMATE 25 MG PO TABS
25.0000 mg | ORAL_TABLET | Freq: Once | ORAL | Status: AC
  Administered 2016-11-29: 25 mg via ORAL
  Filled 2016-11-29: qty 1

## 2016-11-29 MED ORDER — TOPIRAMATE 25 MG PO TABS: 25.0000 mg | ORAL_TABLET | Freq: Every day | ORAL | 0 refills | Status: DC

## 2016-11-29 MED ORDER — FENTANYL CITRATE (PF) 100 MCG/2ML IJ SOLN
25.0000 ug | Freq: Once | INTRAMUSCULAR | Status: AC
  Administered 2016-11-29: 25 ug via INTRAMUSCULAR
  Filled 2016-11-29: qty 2

## 2016-11-29 NOTE — ED Provider Notes (Signed)
[facility] DEPT Provider Note   CSN: [id number] Arrival date & time: 11/29/16  1920     History   Chief Complaint Chief Complaint  Patient presents with  . Facial Pain    HPI Calaya Schab is a 76 y.o. female.  HPI Patient has a flare for trigeminal neuralgia. Began to get worse today. Has had gamma knife in the past and is scheduled to see [facility] in 2 days for a consultation for more gamma knife. She has the feeling of electricity on her right face. Typical flare for her. Unrelieved with her Tylenol at home. Unrelieved with her Lyrica at home. Reviewed records from her neurologist, Dr. Jannifer Franklin and the recommendation was to start on Topamax if she needed more medicines. No fevers. No new symptoms to his worsening pain.   Past Medical History:  Diagnosis Date  . Hypothyroidism   . Osteopenia   . Trigeminal neuralgia    Right V2 distribution  . Vitamin D deficiency     Patient Active Problem List   Diagnosis Date Noted  . Trigeminal neuralgia 05/09/2014  . 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 APPENDECTOMY    . CATARACT EXTRACTION Bilateral   . gamma knife     procedure  . TONSILLECTOMY    . TUBAL LIGATION Bilateral     OB History    No data available       Home Medications    Prior to Admission medications   Medication Sig Start Date End Date Taking? Authorizing Provider  Calcium Carbonate-Vitamin D (CALTRATE 600+D) 600-400 MG-UNIT per tablet Take 2 tablets by mouth daily.    Yes Historical Provider, MD  Cholecalciferol (VITAMIN D3) 2000 UNITS TABS Take 1 tablet by mouth daily.   Yes Historical Provider, MD  levothyroxine (SYNTHROID, LEVOTHROID) 88 MCG tablet Take 88 mcg by mouth daily before breakfast.   Yes Historical Provider, MD  pregabalin (LYRICA) 150 MG capsule Take 1 capsule (150 mg total) by mouth 3 (three) times daily. 10/18/16  Yes Kathrynn Ducking, MD  ALPRAZolam Duanne Moron) 0.5 MG  tablet Take 2 tablets approximately 45 minutes prior to the MRI study, take a third tablet if needed. Patient not taking: Reported on 11/29/2016 08/20/16   Kathrynn Ducking, MD  dexamethasone (DECADRON) 2 MG tablet Begin 3 tablets the first day, 2 tablets the next and 1 tablet the third day Patient not taking: Reported on 11/29/2016 08/12/16   Kathrynn Ducking, MD  topiramate (TOPAMAX) 25 MG tablet Take 1 tablet (25 mg total) by mouth daily. 11/29/16   Davonna Belling, MD    Family History Family History  Problem Relation Age of Onset  . Pneumonia Mother   . Diabetes Sister     Social History Social History  Substance Use Topics  . Smoking status: Never Smoker  . Smokeless tobacco: Never Used  . Alcohol use No     Allergies   Amoxicillin; Codeine; Trileptal [oxcarbazepine]; Ultram [tramadol]; and Dilantin [phenytoin sodium extended]   Review of Systems Review of Systems  Constitutional: Negative for appetite change and fever.  HENT:       Pain to right side of her face.  Respiratory: Negative for shortness of breath.   Cardiovascular: Negative for chest pain.  Neurological: Negative for speech difficulty, weakness and light-headedness.     Physical Exam Updated Vital Signs BP 150/83   Pulse 67   Temp 98.1 F (36.7 C) (Oral)  Resp 11   Ht 5' (1.524 m)   Wt 110 lb (49.9 kg)   SpO2 98%   BMI 21.48 kg/m   Physical Exam  Constitutional: She is oriented to person, place, and time. She appears well-developed. No distress.  HENT:  Pain with palpation to the right side of her face.  Eyes: Pupils are equal, round, and reactive to light.  Cardiovascular: Normal rate.   Neurological: She is alert and oriented to person, place, and time.  Skin: Skin is warm. She is not diaphoretic.  Psychiatric: She has a normal mood and affect.     ED Treatments / Results  Labs (all labs ordered are listed, but only abnormal results are displayed) Labs Reviewed - No data to  display  EKG  EKG Interpretation None       Radiology No results found.  Procedures Procedures (including critical care time)  Medications Ordered in ED Medications  fentaNYL (SUBLIMAZE) injection 25 mcg (25 mcg Intramuscular Given 11/29/16 2133)  topiramate (TOPAMAX) tablet 25 mg (25 mg Oral Given 11/29/16 2133)     Initial Impression / Assessment and Plan / ED Course  I have reviewed the triage vital signs and the nursing notes.  Pertinent labs & imaging results that were available during my care of the patient were reviewed by me and considered in my medical decision making (see chart for details).  Clinical Course     Patient with history of trigeminal neuralgia. Pain increased today. Feels better after 25 g of fentanyl. Reviewed neurology note and started patient on a low-dose of Topamax. Will start 25 mg once a day and she will follow with Dr. Jannifer Franklin for further dosing.  Final Clinical Impressions(s) / ED Diagnoses   Final diagnoses:  Trigeminal neuralgia    New Prescriptions Discharge Medication List as of 11/29/2016 10:40 PM    START taking these medications   Details  topiramate (TOPAMAX) 25 MG tablet Take 1 tablet (25 mg total) by mouth daily., Starting Mon 11/29/2016, Print         Davonna Belling, MD 11/30/16 0000

## 2016-11-29 NOTE — ED Triage Notes (Signed)
Pt states that she has trigeminal neuralgia and that her medication is not working. Pt states that she gets nerve  shots in her face, today is having difficulty speaking because of the pain, neuro intact.

## 2016-11-29 NOTE — ED Notes (Signed)
Pt stable, ambulatory, states understanding of discharge instructions 

## 2016-11-30 ENCOUNTER — Emergency Department (HOSPITAL_COMMUNITY)
Admission: EM | Admit: 2016-11-30 | Discharge: 2016-11-30 | Disposition: A | Discharge status: Home / Self Care | Payer: Medicare Other

## 2016-11-30 ENCOUNTER — Encounter: Payer: Self-pay | Admitting: Neurology

## 2016-11-30 ENCOUNTER — Telehealth: Payer: Self-pay | Admitting: Diagnostic Neuroimaging

## 2016-11-30 MED ORDER — TOPIRAMATE 25 MG PO TABS: 25.0000 mg | ORAL_TABLET | Freq: Two times a day (BID) | ORAL | 1 refills | Status: DC

## 2016-11-30 MED ORDER — PREDNISONE 10 MG PO TABS: ORAL_TABLET | ORAL | 0 refills | Status: DC

## 2016-11-30 NOTE — Telephone Encounter (Signed)
Pt husband called in; patient having more pain. Went to ER yesterday and was treated with fentanyl and started on topiramate. Pain was better until today. Now severe pain and husband has called paramedics to take her to ER. Offered hydrocodone or oxycodone rx, but they are not sure about prior trials or allergic reactions (she has had adverse effects from codeine and tramadol in the past). She also has appt with [facility] gamma knife consult tomorrow. Agree with ER eval for now and will send message to Dr. Jannifer Franklin for review. -VRP

## 2016-11-30 NOTE — Telephone Encounter (Signed)
I called the patient. The TN pain remains unbearable. She is nin the ER again. I will call in a prednisone dosepack and increase the Topamax to 25 mg BID. She will be seen tomorrow at [facility].

## 2016-11-30 NOTE — ED Notes (Signed)
Per registration the pts family reports that the pt is leaving, per family the pts doctor has returned their call & is calling in a prescription for the pt

## 2016-11-30 NOTE — Addendum Note (Signed)
Addended by: Margette Fast on: 11/30/2016 01:06 PM   Modules accepted: Orders

## 2016-12-06 ENCOUNTER — Encounter: Payer: Self-pay | Admitting: Neurology

## 2016-12-06 ENCOUNTER — Ambulatory Visit (INDEPENDENT_AMBULATORY_CARE_PROVIDER_SITE_OTHER): Payer: Medicare Other | Admitting: Neurology

## 2016-12-06 VITALS — BP 138/82 | HR 71 | Ht 60.0 in | Wt 108.0 lb

## 2016-12-06 DIAGNOSIS — G500 Trigeminal neuralgia: Secondary | ICD-10-CM | POA: Diagnosis not present

## 2016-12-06 MED ORDER — TOPIRAMATE 50 MG PO TABS: 50.0000 mg | ORAL_TABLET | Freq: Two times a day (BID) | ORAL | 3 refills | Status: DC

## 2016-12-06 NOTE — Progress Notes (Signed)
Reason for visit: Trigeminal neuralgia  Yolanda Garrett is an 76 y.o. female  History of present illness:  Yolanda Garrett is a 76 year old right-handed white female with a history of a right V2 distribution trigeminal neuralgia. The patient has had significant worsening of pain, she had to go to the emergency room on the 11/29/2016 and 11/30/2016 for medical management. The patient has been placed on a prednisone Dosepak, she currently is being tapered off of this. She has been placed on Topamax which she believes has been helpful taking 25 mg twice daily. The patient is not eating solid food, she is placing food in a blender and drinking food. The patient still has twinges of pain, if she touches the right midface she may get increased discomfort. She returns to this office for an evaluation. She feels spacey and somewhat staggery on her current regimen of medication. She has been seen through [facility] recently, she will be set up for a gamma knife procedure on 01/04/2017.   Past Medical History:  Diagnosis Date  . Hypothyroidism   . Osteopenia   . Trigeminal neuralgia    Right V2 distribution  . Vitamin D deficiency     Past Surgical History:  Procedure Laterality Date  . APPENDECTOMY    . CATARACT EXTRACTION Bilateral   . gamma knife     procedure  . TONSILLECTOMY    . TUBAL LIGATION Bilateral     Family History  Problem Relation Age of Onset  . Pneumonia Mother   . Diabetes Sister     Social history:  reports that she has never smoked. She has never used smokeless tobacco. She reports that she does not drink alcohol or use drugs.    Allergies  Allergen Reactions  . Amoxicillin   . Codeine   . Trileptal [Oxcarbazepine]   . Ultram [Tramadol] Nausea And Vomiting  . Dilantin [Phenytoin Sodium Extended]     Muscle spasms  . Hydromorphone Nausea Only    Medications:  Prior to Admission medications   Medication Sig Start Date End Date Taking? Authorizing Provider    acetaminophen (TYLENOL) 500 MG tablet Take 500 mg by mouth.   Yes Historical Provider, MD  Calcium Carbonate-Vitamin D (CALTRATE 600+D) 600-400 MG-UNIT per tablet Take 2 tablets by mouth daily.    Yes Historical Provider, MD  Cholecalciferol (VITAMIN D3) 2000 UNITS TABS Take 1 tablet by mouth daily.   Yes Historical Provider, MD  levothyroxine (SYNTHROID, LEVOTHROID) 88 MCG tablet Take 88 mcg by mouth daily before breakfast.   Yes Historical Provider, MD  predniSONE (DELTASONE) 10 MG tablet Begin taking 6 tablets daily, taper by one tablet every other day until off the medication. 11/30/16  Yes Kathrynn Ducking, MD  pregabalin (LYRICA) 150 MG capsule Take 1 capsule (150 mg total) by mouth 3 (three) times daily. 10/18/16  Yes Kathrynn Ducking, MD  topiramate (TOPAMAX) 50 MG tablet Take 1 tablet (50 mg total) by mouth 2 (two) times daily. 12/06/16   Kathrynn Ducking, MD    ROS:  Out of a complete 14 system review of symptoms, the patient complains only of the following symptoms, and all other reviewed systems are negative.  Decreased weight  Facial pain    Blood pressure 138/82, pulse 71, height 5' (1.524 m), weight 108 lb (49 kg).  Physical Exam  General: The patient is alert and cooperative at the time of the examination.  Skin: No significant peripheral edema is noted.   Neurologic  Exam  Mental status: The patient is alert and oriented x 3 at the time of the examination. The patient has apparent normal recent and remote memory, with an apparently normal attention span and concentration ability.   Cranial nerves: Facial symmetry is present. Speech is normal, no aphasia or dysarthria is noted. Extraocular movements are full. Visual fields are full.  Motor: The patient has good strength in all 4 extremities.  Sensory examination: Soft touch sensation is symmetric on the face, arms, and legs.  Coordination: The patient has good finger-nose-finger and heel-to-shin bilaterally.  Gait  and station: The patient has a normal gait. Tandem gait is unsteady. Romberg is negative. No drift is seen.  Reflexes: Deep tendon reflexes are symmetric.   Assessment/Plan:  1. Right V2 distribution trigeminal neuralgia   The patient continues to have severe pain, she will continue the prednisone taper, she will go up on the Topamax taking 1 in the morning and 2 in the evening of the 25 mg tablets, then she will be converted to 50 mg twice daily. She will follow-up in 3 months, she is to call for any dose adjustments of her medication.   Jill Alexanders MD 12/06/2016 11:24 AM  [facility] [address] [place], [place] [address]  Phone [phone] Fax [phone]

## 2016-12-06 NOTE — Patient Instructions (Signed)
   With the Topamax 25 mg, begin taking one in the morning and 2 in the evening for one week, then take 50 mg (2 of the 25 mg tablets) twice a day.

## 2016-12-07 ENCOUNTER — Telehealth: Payer: Self-pay | Admitting: Neurology

## 2016-12-07 NOTE — Telephone Encounter (Signed)
Patient's husband is calling. She was seen yesterday and failed to mention to Dr. Jannifer Franklin when she takes predniSONE (DELTASONE) 10 MG tablet she immediately stops getting electrical shock in her mouth. When she stops taking  predniSONE (DELTASONE) 10 MG tablet the electrical shock in her mouth returns. The patient wanted Dr. Jannifer Franklin to know.

## 2016-12-07 NOTE — Telephone Encounter (Signed)
I called patient, talk with her husband. The patient just 1 in the left me know that when she is on the prednisone the pain is much better, this is why we are going up on the Topamax cause as she is coming down off of the prednisone, the pain may return. If the pain does return, we may have to get her back on prednisone until she gets the gamma knife therapy.

## 2016-12-31 MED ORDER — PREDNISONE 10 MG PO TABS: 20.0000 mg | ORAL_TABLET | Freq: Every day | ORAL | 1 refills | Status: DC

## 2016-12-31 MED ORDER — TOPIRAMATE 50 MG PO TABS: ORAL_TABLET | ORAL | 3 refills | Status: DC

## 2016-12-31 NOTE — Addendum Note (Signed)
Addended by: Margette Fast on: 12/31/2016 04:00 PM   Modules accepted: Orders

## 2016-12-31 NOTE — Telephone Encounter (Signed)
I called the patient, talk with the husband. The patient is still having some breakthrough pain with the medication. Gamma knife procedure will be next week. We will go up on the Topamax taking 1 in the morning and 2 in the evening of the 50 mg tablets, go on a scheduled dose of prednisone 20 mg daily at least for the next 10 days.

## 2016-12-31 NOTE — Telephone Encounter (Signed)
Patient's husband is calling stating the patient is scheduled for gamma knife therapy on 01-04-17.  She wakes up about 4 in the morning in pain. Please call to discuss the advise given in previous message of taking Prednisone until her gamma knife therapy.

## 2017-01-06 ENCOUNTER — Encounter: Payer: Self-pay | Admitting: Neurology

## 2017-02-01 ENCOUNTER — Encounter: Payer: Self-pay | Admitting: Neurology

## 2017-02-04 ENCOUNTER — Encounter: Payer: Self-pay | Admitting: Neurology

## 2017-02-11 ENCOUNTER — Ambulatory Visit: Payer: Medicare Other | Admitting: Neurology

## 2017-02-17 ENCOUNTER — Encounter: Payer: Self-pay | Admitting: Neurology

## 2017-02-17 ENCOUNTER — Other Ambulatory Visit: Payer: Self-pay | Admitting: Neurology

## 2017-02-17 MED ORDER — TOPIRAMATE 25 MG PO TABS: 25.0000 mg | ORAL_TABLET | Freq: Two times a day (BID) | ORAL | 1 refills | Status: DC

## 2017-03-08 ENCOUNTER — Encounter: Payer: Self-pay | Admitting: Neurology

## 2017-03-08 ENCOUNTER — Ambulatory Visit (INDEPENDENT_AMBULATORY_CARE_PROVIDER_SITE_OTHER): Payer: Medicare Other | Admitting: Neurology

## 2017-03-08 VITALS — BP 146/90 | HR 68 | Resp 12 | Ht 60.0 in | Wt 107.0 lb

## 2017-03-08 DIAGNOSIS — G500 Trigeminal neuralgia: Secondary | ICD-10-CM

## 2017-03-08 NOTE — Progress Notes (Signed)
Reason for visit: Trigeminal neuralgia  Yolanda Garrett is an 76 y.o. female  History of present illness:  Yolanda Garrett is a 76 year old right-handed white female with a history of trigeminal neuralgia on the right V2 distribution. The patient has undergone gamma knife procedure with good improvement, the procedure is now starting to improve the pain, the patient has been able to taper down on the Topamax to 25 mg daily, she remains on Lyrica 150 mg twice daily. The patient has had no recent twinges of pain. She has reported some clumsiness of the right hand, EMG and nerve conduction study done in the fall of 2017 was unremarkable. The patient reports that she has not had any change in her handwriting. The patient notes that if she stands on her toe with the right foot the right hand function will improve. She denies any pain or numbness of the hand. She returns to this office for an evaluation.  Past Medical History:  Diagnosis Date  . Hypothyroidism   . Osteopenia   . Trigeminal neuralgia    Right V2 distribution  . Vitamin D deficiency     Past Surgical History:  Procedure Laterality Date  . APPENDECTOMY    . CATARACT EXTRACTION Bilateral   . gamma knife     procedure  . TONSILLECTOMY    . TUBAL LIGATION Bilateral     Family History  Problem Relation Age of Onset  . Pneumonia Mother   . Diabetes Sister     Social history:  reports that she has never smoked. She has never used smokeless tobacco. She reports that she does not drink alcohol or use drugs.    Allergies  Allergen Reactions  . Amoxicillin   . Codeine   . Trileptal [Oxcarbazepine]   . Ultram [Tramadol] Nausea And Vomiting  . Dilantin [Phenytoin Sodium Extended]     Muscle spasms  . Hydromorphone Nausea Only    Medications:  Prior to Admission medications   Medication Sig Start Date End Date Taking? Authorizing Provider  Calcium Carbonate-Vitamin D (CALTRATE 600+D) 600-400 MG-UNIT per tablet Take 2  tablets by mouth daily.    Yes Historical Provider, MD  Cholecalciferol (VITAMIN D3) 2000 UNITS TABS Take 1 tablet by mouth daily.   Yes Historical Provider, MD  levothyroxine (SYNTHROID, LEVOTHROID) 88 MCG tablet Take 88 mcg by mouth daily before breakfast.   Yes Historical Provider, MD  pregabalin (LYRICA) 150 MG capsule Take 1 capsule (150 mg total) by mouth 3 (three) times daily. 10/18/16  Yes Kathrynn Ducking, MD  topiramate (TOPAMAX) 25 MG tablet Take 1 tablet (25 mg total) by mouth 2 (two) times daily. Patient taking differently: Take 25 mg by mouth daily.  02/17/17  Yes Kathrynn Ducking, MD    ROS:  Out of a complete 14 system review of symptoms, the patient complains only of the following symptoms, and all other reviewed systems are negative.  Right-handed clumsiness  Blood pressure (!) 146/90, pulse 68, resp. rate 12, height 5' (1.524 m), weight 107 lb (48.5 kg).  Physical Exam  General: The patient is alert and cooperative at the time of the examination.  Skin: No significant peripheral edema is noted.   Neurologic Exam  Mental status: The patient is alert and oriented x 3 at the time of the examination. The patient has apparent normal recent and remote memory, with an apparently normal attention span and concentration ability.   Cranial nerves: Facial symmetry is present. Speech is normal,  no aphasia or dysarthria is noted. Extraocular movements are full. Visual fields are full. Mild masking of the face is seen.  Motor: The patient has good strength in all 4 extremities.  Sensory examination: Soft touch sensation is symmetric on the face, arms, and legs.  Coordination: The patient has good finger-nose-finger and heel-to-shin bilaterally. No resting tremor is seen.  Gait and station: The patient has a normal gait, but there does appear to be decreased arm swing bilaterally, possibly more prominent on the right. Tandem gait is normal. Romberg is negative. No drift is  seen.  Reflexes: Deep tendon reflexes are symmetric.   Assessment/Plan:  1. Right V2 distribution trigeminal neuralgia  2. Right hand clumsiness reported  The patient has had good improvement in the trigeminal neuralgia discomfort following the gamma knife procedure, the patient will be tapering down off of the Topamax within the next 2 weeks, in the third week in May she will call me and if she is still doing well we will begin a taper of the Lyrica back down to 100 mg twice daily dosing. We will need to follow the right arm dysfunction, it is possible that the patient may be developing Parkinson's disease. She will follow-up in 5 months. We discussed the possibility of occupational therapy for her right hand, but she will contact me if she decides that she wants to engage in this therapy.  Jill Alexanders MD 03/08/2017 7:26 AM  [facility] [address] [place], [place] [address]  Phone [phone] Fax [phone]

## 2017-03-08 NOTE — Patient Instructions (Signed)
   Stop the Topamax in 2 weeks, call me around the third week of May to taper off of the Lyrica to a lower dose.

## 2017-04-11 ENCOUNTER — Encounter: Payer: Self-pay | Admitting: Neurology

## 2017-05-03 ENCOUNTER — Encounter: Payer: Self-pay | Admitting: Neurology

## 2017-05-06 ENCOUNTER — Other Ambulatory Visit: Payer: Self-pay | Admitting: Neurology

## 2017-05-10 ENCOUNTER — Telehealth: Payer: Self-pay | Admitting: Neurology

## 2017-05-10 NOTE — Telephone Encounter (Signed)
Faxed printed/signed rx lyrica to pt pharmacy. Fax: [phone]. Received confirmation.

## 2017-05-10 NOTE — Telephone Encounter (Signed)
[facility] called said they rec'd a fax for this pt but it did not have a medication written on it. Please refax

## 2017-05-10 NOTE — Telephone Encounter (Signed)
Re-faxed prescription as requested. Received confirmation.

## 2017-06-02 ENCOUNTER — Other Ambulatory Visit: Payer: Self-pay | Admitting: Neurology

## 2017-06-21 ENCOUNTER — Encounter: Payer: Self-pay | Admitting: Neurology

## 2017-07-25 ENCOUNTER — Other Ambulatory Visit: Payer: Self-pay | Admitting: Family Medicine

## 2017-07-25 DIAGNOSIS — Z1231 Encounter for screening mammogram for malignant neoplasm of breast: Secondary | ICD-10-CM

## 2017-08-24 ENCOUNTER — Ambulatory Visit: Payer: Medicare Other | Admitting: Neurology

## 2017-08-24 ENCOUNTER — Encounter: Payer: Self-pay | Admitting: Neurology

## 2017-08-24 VITALS — BP 120/71 | HR 68 | Ht 60.0 in | Wt 107.5 lb

## 2017-08-24 DIAGNOSIS — H9193 Unspecified hearing loss, bilateral: Secondary | ICD-10-CM | POA: Diagnosis not present

## 2017-08-24 DIAGNOSIS — G500 Trigeminal neuralgia: Secondary | ICD-10-CM

## 2017-08-24 MED ORDER — PREGABALIN 100 MG PO CAPS: 100.0000 mg | ORAL_CAPSULE | Freq: Three times a day (TID) | ORAL | 4 refills | Status: DC

## 2017-08-24 NOTE — Patient Instructions (Signed)
   With the Lyrica, we will initiate a taper.  Week 1-2 take 150/ 100/ 150  Week 3-4 take 100/ 100/ 150  Week 5 and after 100 mg three times a day.   If the pain returns with the taper, we will go back up on the dose.  I will get an ENT referral set up.

## 2017-08-24 NOTE — Progress Notes (Signed)
Reason for visit: Trigeminal neuralgia  Yolanda Garrett is an 76 y.o. female  History of present illness:  Yolanda Garrett is a 76 year old right-handed white female with a history of right V2 distribution trigeminal neuralgia. The patient has done quite well since June 2018. She has not had any twinges of pain and she has been able to come off of the Topamax. She remains on Lyrica 150 mg 3 times daily. She claims that over the last 6 months that she has developed muffled hearing in both the ears that has not improved. This started when she went on Topamax but it has not gotten better coming off the medication. The patient still feels somewhat dizzy on the Lyrica. The patient is able to eat and swallow without any pain whatsoever. The patient does have some residual numbness of the right side of the tongue and in the right mouth area following the gamma knife treatments. At times she may have a scratchy feeling in her right eye. At times she has a dull achy pain around the right ear. The patient still has a sensation of clumsiness of the right hand with handwriting. She has not had any alteration in her ability to ambulate. She returns for an evaluation.  Past Medical History:  Diagnosis Date  . Hypothyroidism   . Osteopenia   . Trigeminal neuralgia    Right V2 distribution  . Vitamin D deficiency     Past Surgical History:  Procedure Laterality Date  . APPENDECTOMY    . CATARACT EXTRACTION Bilateral   . gamma knife     procedure  . TONSILLECTOMY    . TUBAL LIGATION Bilateral     Family History  Problem Relation Age of Onset  . Pneumonia Mother   . Diabetes Sister     Social history:  reports that she has never smoked. She has never used smokeless tobacco. She reports that she does not drink alcohol or use drugs.    Allergies  Allergen Reactions  . Amoxicillin   . Codeine   . Trileptal [Oxcarbazepine]   . Ultram [Tramadol] Nausea And Vomiting  . Dilantin [Phenytoin Sodium  Extended]     Muscle spasms  . Hydromorphone Nausea Only    Medications:  Prior to Admission medications   Medication Sig Start Date End Date Taking? Authorizing Provider  alendronate (FOSAMAX) 70 MG tablet Take 70 mg by mouth.   Yes [provider]  Calcium Carbonate-Vitamin D (CALTRATE 600+D) 600-400 MG-UNIT per tablet Take 2 tablets by mouth daily.    Yes [provider]  Cholecalciferol (VITAMIN D3) 2000 UNITS TABS Take 1 tablet by mouth daily.   Yes [provider]  levothyroxine (SYNTHROID, LEVOTHROID) 88 MCG tablet Take 88 mcg by mouth daily before breakfast.   Yes [provider]  LYRICA 150 MG capsule TAKE ONE CAPSULE BY MOUTH THREE TIMES DAILY 05/10/17  Yes Kathrynn Ducking, MD  pregabalin (LYRICA) 100 MG capsule Take 1 capsule (100 mg total) by mouth 3 (three) times daily. 08/24/17   Kathrynn Ducking, MD    ROS:  Out of a complete 14 system review of symptoms, the patient complains only of the following symptoms, and all other reviewed systems are negative.  Decreased hearing  Blood pressure 120/71, pulse 68, height 5' (1.524 m), weight 107 lb 8 oz (48.8 kg).  Physical Exam  General: The patient is alert and cooperative at the time of the examination.  Ears: Tympanic membranes are clear bilaterally,  no evidence of a cerumen plug is seen.  Skin: No significant peripheral edema is noted.   Neurologic Exam  Mental status: The patient is alert and oriented x 3 at the time of the examination. The patient has apparent normal recent and remote memory, with an apparently normal attention span and concentration ability.   Cranial nerves: Facial symmetry is present. Speech is normal, no aphasia or dysarthria is noted. Extraocular movements are full. Visual fields are full. Mild masking of the face is seen. The patient has some slight decreased sensation on the right lower face as compared to left.  Motor: The patient has good strength in  all 4 extremities.  Sensory examination: Soft touch sensation is symmetric on the arms, and legs.  Coordination: The patient has good finger-nose-finger and heel-to-shin bilaterally.  Gait and station: The patient is able to rise from seated position with arms crossed. Once up, the patient does have symmetric arm swing that is somewhat decreased bilaterally. The posture is not stooped. Tandem gait is normal. Romberg is negative. No drift is seen.  Reflexes: Deep tendon reflexes are symmetric.   Assessment/Plan:  1. Right trigeminal neuralgia, V2 distribution  2. 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 Her trigeminal neuralgia pain is now gone, we will try to taper off of the Lyrica going down by 50 mg every 2 weeks until she gets to 100 mg 3 times daily. She will contact me if the pain returns. If she does well on this lower dose, the Lyrica may be able to be discontinued completely in the future. She will follow-up in 5 months. I will set up a referral for an ENT evaluation for the altered hearing.   Jill Alexanders MD 08/24/2017 7:56 AM  [facility] [address] [place], [place] [address]  Phone [phone] Fax [phone]

## 2017-08-24 NOTE — Progress Notes (Signed)
Faxed printed/signed rx pregabalin to [facility] on [address] at [phone]. Received fax confirmation.

## 2017-08-31 ENCOUNTER — Encounter: Payer: Self-pay | Admitting: Neurology

## 2017-09-08 ENCOUNTER — Ambulatory Visit
Admission: RE | Admit: 2017-09-08 | Discharge: 2017-09-08 | Disposition: A | Discharge status: Home / Self Care | Payer: Medicare Other | Source: Ambulatory Visit | Attending: Family Medicine | Admitting: Family Medicine

## 2017-09-08 DIAGNOSIS — Z1231 Encounter for screening mammogram for malignant neoplasm of breast: Secondary | ICD-10-CM

## 2017-10-31 ENCOUNTER — Other Ambulatory Visit: Payer: Self-pay | Admitting: Family Medicine

## 2017-10-31 DIAGNOSIS — N6459 Other signs and symptoms in breast: Secondary | ICD-10-CM

## 2017-10-31 DIAGNOSIS — R5381 Other malaise: Secondary | ICD-10-CM

## 2017-10-31 DIAGNOSIS — R234 Changes in skin texture: Secondary | ICD-10-CM

## 2017-11-04 ENCOUNTER — Ambulatory Visit
Admission: RE | Admit: 2017-11-04 | Discharge: 2017-11-04 | Disposition: A | Discharge status: Home / Self Care | Payer: Medicare Other | Source: Ambulatory Visit | Attending: Family Medicine | Admitting: Family Medicine

## 2017-11-04 DIAGNOSIS — N6459 Other signs and symptoms in breast: Secondary | ICD-10-CM

## 2017-11-04 DIAGNOSIS — R234 Changes in skin texture: Secondary | ICD-10-CM

## 2018-01-11 ENCOUNTER — Encounter: Payer: Self-pay | Admitting: Neurology

## 2018-01-24 ENCOUNTER — Encounter: Payer: Self-pay | Admitting: Neurology

## 2018-01-24 ENCOUNTER — Ambulatory Visit: Payer: Medicare Other | Admitting: Neurology

## 2018-01-24 VITALS — BP 137/83 | HR 70 | Ht 60.0 in | Wt 108.5 lb

## 2018-01-24 DIAGNOSIS — G500 Trigeminal neuralgia: Secondary | ICD-10-CM

## 2018-01-24 MED ORDER — PREGABALIN 100 MG PO CAPS: 100.0000 mg | ORAL_CAPSULE | Freq: Three times a day (TID) | ORAL | 4 refills | Status: DC

## 2018-01-24 NOTE — Progress Notes (Signed)
Reason for visit: Trigeminal neuralgia  Yolanda Garrett is an 77 y.o. female  History of present illness:  Yolanda Garrett is a 77 year old right-handed white female with a history of a right V2 distribution trigeminal neuralgia.  The patient has done fairly well with her pain, she remains on Lyrica taking 100 mg 3 times daily.  The patient has had occasional twinge of discomfort within the last month or so, she has remained on the 100 mg capsules.  She also reports some muffled hearing, she is no longer able to equalize the pressure in her ears, she denies any tinnitus.  She has a fullness sensation in the ears that may occur off and on.  The patient is also followed for parkinsonism, she has not noted any change in her ability to ambulate, she has had no falls, she denies any problems with chewing or swallowing and she has not had any problems with drooling.  She returns to this office for an evaluation.  Past Medical History:  Diagnosis Date  . Hypothyroidism   . Osteopenia   . Trigeminal neuralgia    Right V2 distribution  . Vitamin D deficiency     Past Surgical History:  Procedure Laterality Date  . APPENDECTOMY    . BREAST EXCISIONAL BIOPSY Right   . CATARACT EXTRACTION Bilateral   . gamma knife     procedure  . TONSILLECTOMY    . TUBAL LIGATION Bilateral     Family History  Problem Relation Age of Onset  . Pneumonia Mother   . Diabetes Sister     Social history:  reports that  has never smoked. she has never used smokeless tobacco. She reports that she does not drink alcohol or use drugs.    Allergies  Allergen Reactions  . Amoxicillin   . Codeine   . Trileptal [Oxcarbazepine]   . Ultram [Tramadol] Nausea And Vomiting  . Dilantin [Phenytoin Sodium Extended]     Muscle spasms  . Hydromorphone Nausea Only    Medications:  Prior to Admission medications   Medication Sig Start Date End Date Taking? Authorizing Provider  Calcium Carbonate-Vitamin D (CALTRATE 600+D)  600-400 MG-UNIT per tablet Take 2 tablets by mouth daily.    Yes [provider]  Cholecalciferol (VITAMIN D3) 2000 UNITS TABS Take 1 tablet by mouth daily.   Yes [provider]  denosumab (PROLIA) 60 MG/ML SOLN injection Inject 60 mg into the skin every 6 (six) months. Administer in upper arm, thigh, or abdomen   Yes [provider]  levothyroxine (SYNTHROID, LEVOTHROID) 100 MCG tablet Take 100 mcg by mouth daily before breakfast.   Yes [provider]  pregabalin (LYRICA) 100 MG capsule Take 1 capsule (100 mg total) by mouth 3 (three) times daily. 01/24/18  Yes Kathrynn Ducking, MD    ROS:  Out of a complete 14 system review of symptoms, the patient complains only of the following symptoms, and all other reviewed systems are negative.  Hearing loss  Blood pressure 137/83, pulse 70, height 5' (1.524 m), weight 108 lb 8 oz (49.2 kg).  Physical Exam  General: The patient is alert and cooperative at the time of the examination.  Skin: No significant peripheral edema is noted.   Neurologic Exam  Mental status: The patient is alert and oriented x 3 at the time of the examination. The patient has apparent normal recent and remote memory, with an apparently normal attention span and concentration ability.   Cranial nerves:  Facial symmetry is present. Speech is normal, no aphasia or dysarthria is noted. Extraocular movements are full, with exception of some slight restriction of superior gaze. Visual fields are full.  Masking of the face is seen.  Motor: The patient has good strength in all 4 extremities.  Sensory examination: Soft touch sensation is symmetric on the face, arms, and legs.  Coordination: The patient has good finger-nose-finger and heel-to-shin bilaterally.  Gait and station: The patient has a normal gait.  The patient takes good stride, she is able to rise from a seated position with the arms crossed.  With walking, the patient has  decreased arm swing bilaterally, more prominent on the right.  No tremor is seen.  Tandem gait is normal. Romberg is negative. No drift is seen.  Reflexes: Deep tendon reflexes are symmetric.   Assessment/Plan:  1.  Parkinsonism  2.  Right V2 trigeminal neuralgia  The patient has done well following a gamma knife procedure for the right trigeminal neuralgia.  She did have an occasional twinge of pain recently, she will remain on Lyrica 100 mg 3 times daily for now, she will call me in 2 months if she is doing well and we will try to taper her slowly off of this medication.  The patient will follow-up in 6 months.  A prescription for Lyrica was sent in.  She will continue to be followed for any developing signs of Parkinson's disease.  Jill Alexanders MD 01/24/2018 9:56 AM  [facility] [address] [place], [place] [address]  Phone [phone] Fax [phone]

## 2018-05-22 ENCOUNTER — Encounter: Payer: Self-pay | Admitting: Neurology

## 2018-05-23 ENCOUNTER — Other Ambulatory Visit: Payer: Self-pay | Admitting: Neurology

## 2018-05-23 MED ORDER — PREGABALIN 100 MG PO CAPS: 100.0000 mg | ORAL_CAPSULE | Freq: Three times a day (TID) | ORAL | 1 refills | Status: DC

## 2018-08-02 ENCOUNTER — Encounter: Payer: Self-pay | Admitting: Neurology

## 2018-08-02 ENCOUNTER — Ambulatory Visit: Payer: Medicare Other | Admitting: Neurology

## 2018-08-02 ENCOUNTER — Other Ambulatory Visit: Payer: Self-pay

## 2018-08-02 VITALS — BP 118/80 | HR 65 | Ht 60.0 in | Wt 111.0 lb

## 2018-08-02 DIAGNOSIS — G20 Parkinson's disease: Secondary | ICD-10-CM | POA: Diagnosis not present

## 2018-08-02 DIAGNOSIS — G20A1 Parkinson's disease without dyskinesia, without mention of fluctuations: Secondary | ICD-10-CM

## 2018-08-02 DIAGNOSIS — G500 Trigeminal neuralgia: Secondary | ICD-10-CM

## 2018-08-02 NOTE — Progress Notes (Signed)
Reason for visit: Trigeminal neuralgia  Haleemah Buckalew is an 77 y.o. female  History of present illness:  Ms. Grenz is a 77 year old right-handed white female with a history of right V2 distribution trigeminal neuralgia.  The patient has undergone a gamma knife procedure, she has responded well to this, she essentially has no pain at this time.  She does have some slight numbness when she brushes her teeth, but discomfort is no longer a problem.  She remains on Lyrica taking 100 mg 3 times daily, she tolerates the medication fairly well.  She has not had any change in her mobility with walking, she has noted a 2-year history of some stiffness sensation in the right arm, this has not progressed.  The patient is able to remain active, she denies any problems with tremors or difficulty with stiffness of the right leg.  She returns for an evaluation.  She continues to report problems that are consistent with bilateral eustachian tube dysfunction.  She is not able to equalize pressure in her ears, she may develop a muffled type hearing with altitude changes, occasionally she can pop the left ear but not the right.  She is afraid to fly in an airplane because of this.  She has been seen by Dr. Constance Holster, she does not feel that this issue was addressed.  Past Medical History:  Diagnosis Date  . Hypothyroidism   . Osteopenia   . Trigeminal neuralgia    Right V2 distribution  . Vitamin D deficiency     Past Surgical History:  Procedure Laterality Date  . APPENDECTOMY    . BREAST EXCISIONAL BIOPSY Right   . CATARACT EXTRACTION Bilateral   . gamma knife     procedure  . TONSILLECTOMY    . TUBAL LIGATION Bilateral     Family History  Problem Relation Age of Onset  . Pneumonia Mother   . Diabetes Sister     Social history:  reports that she has never smoked. She has never used smokeless tobacco. She reports that she does not drink alcohol or use drugs.    Allergies  Allergen Reactions  .  Amoxicillin   . Codeine   . Trileptal [Oxcarbazepine]   . Ultram [Tramadol] Nausea And Vomiting  . Dilantin [Phenytoin Sodium Extended]     Muscle spasms  . Fosamax [Alendronate Sodium]     Right side of jaw stiffened up per pt  . Hydromorphone Nausea Only    Medications:  Prior to Admission medications   Medication Sig Start Date End Date Taking? Authorizing Provider  Calcium Carbonate-Vitamin D (CALTRATE 600+D) 600-400 MG-UNIT per tablet Take 2 tablets by mouth daily.    Yes [provider]  Cholecalciferol (VITAMIN D3) 2000 UNITS TABS Take 1 tablet by mouth daily.   Yes [provider]  denosumab (PROLIA) 60 MG/ML SOLN injection Inject 60 mg into the skin every 6 (six) months. Administer in upper arm, thigh, or abdomen   Yes [provider]  levothyroxine (SYNTHROID, LEVOTHROID) 100 MCG tablet Take 100 mcg by mouth daily before breakfast.   Yes [provider]  pregabalin (LYRICA) 100 MG capsule Take 1 capsule (100 mg total) by mouth 3 (three) times daily. 05/23/18  Yes Kathrynn Ducking, MD    ROS:  Out of a complete 14 system review of symptoms, the patient complains only of the following symptoms, and all other reviewed systems are negative.  Hearing loss, ringing in the ears  Blood pressure 118/80,  pulse 65, height 5' (1.524 m), weight 111 lb (50.3 kg), SpO2 98 %.  Physical Exam  General: The patient is alert and cooperative at the time of the examination.  Skin: No significant peripheral edema is noted.   Neurologic Exam  Mental status: The patient is alert and oriented x 3 at the time of the examination. The patient has apparent normal recent and remote memory, with an apparently normal attention span and concentration ability.   Cranial nerves: Facial symmetry is present. Speech is normal, no aphasia or dysarthria is noted. Extraocular movements are full. Visual fields are full.  Masking of the face is seen.  Motor: The patient  has good strength in all 4 extremities.  Sensory examination: Soft touch sensation is symmetric on the face, arms, and legs.  Coordination: The patient has good finger-nose-finger and heel-to-shin bilaterally.  Gait and station: The patient has a normal gait, the patient has symmetric arm swing with walking. Tandem gait is normal. Romberg is negative. No drift is seen.  Reflexes: Deep tendon reflexes are symmetric.   Assessment/Plan:   1.  Right V2 distribution trigeminal neuralgia  2.  Parkinsonism  3.  Probable bilateral eustachian tube dysfunction  The patient is having no discomfort whatsoever at this time, we will try to taper her off of the Lyrica.  She will go to 100 mg twice daily for 4 weeks and then go to 100 mg daily for 4 weeks and then stop the medication.  If she has pain anywhere along the taper, she will need to go back on the usual dose.  The patient will check back with Dr. Constance Holster concerning the eustachian tube dysfunction issue.  We will continue to follow her parkinsonism.  She will follow-up in 6 months.  Jill Alexanders MD 08/02/2018 10:17 AM  [facility] [address] [place], [place] [address]  Phone [phone] Fax [phone]

## 2018-09-11 ENCOUNTER — Other Ambulatory Visit: Payer: Self-pay | Admitting: Family Medicine

## 2018-09-11 DIAGNOSIS — Z1231 Encounter for screening mammogram for malignant neoplasm of breast: Secondary | ICD-10-CM

## 2018-10-19 ENCOUNTER — Ambulatory Visit
Admission: RE | Admit: 2018-10-19 | Discharge: 2018-10-19 | Disposition: A | Discharge status: Home / Self Care | Payer: Medicare Other | Source: Ambulatory Visit | Attending: Family Medicine | Admitting: Family Medicine

## 2018-10-19 DIAGNOSIS — Z1231 Encounter for screening mammogram for malignant neoplasm of breast: Secondary | ICD-10-CM

## 2018-12-15 ENCOUNTER — Other Ambulatory Visit: Payer: Self-pay | Admitting: Neurology

## 2018-12-15 MED ORDER — PREGABALIN 100 MG PO CAPS: 100.0000 mg | ORAL_CAPSULE | Freq: Three times a day (TID) | ORAL | 1 refills | Status: DC

## 2018-12-15 NOTE — Telephone Encounter (Signed)
Megan,RN sent refill request to Dr. Jannifer Franklin via Deloris Ping. Pending approval

## 2019-01-30 ENCOUNTER — Other Ambulatory Visit: Payer: Self-pay | Admitting: Internal Medicine

## 2019-01-30 DIAGNOSIS — M810 Age-related osteoporosis without current pathological fracture: Secondary | ICD-10-CM

## 2019-02-15 ENCOUNTER — Ambulatory Visit: Payer: Medicare Other | Admitting: Neurology

## 2019-03-26 ENCOUNTER — Telehealth: Payer: Self-pay

## 2019-03-26 NOTE — Telephone Encounter (Signed)
Pt has agreed to converting 04/16/19 to a virtual video visit for 03/27/19 at 1:30 pm.   Pt understands that although there may be some limitations with this type of visit, we will take all precautions to reduce any security or privacy concerns.  Pt understands that this will be treated like an in office visit and we will file with pt's insurance, and there may be a patient responsible charge related to this service.  Pt's visit will be completed via doxy me. E-mail link has been sent to [email or web address].

## 2019-03-27 ENCOUNTER — Ambulatory Visit (INDEPENDENT_AMBULATORY_CARE_PROVIDER_SITE_OTHER): Payer: Medicare Other | Admitting: Neurology

## 2019-03-27 ENCOUNTER — Encounter: Payer: Self-pay | Admitting: Neurology

## 2019-03-27 ENCOUNTER — Other Ambulatory Visit: Payer: Self-pay

## 2019-03-27 DIAGNOSIS — G500 Trigeminal neuralgia: Secondary | ICD-10-CM | POA: Diagnosis not present

## 2019-03-27 NOTE — Progress Notes (Signed)
     Virtual Visit via Telephone Note  I connected with Yolanda Garrett [facility] on 03/27/19 at  1:30 PM EDT by telephone and verified that I am speaking with the correct person using two identifiers.   I discussed the limitations, risks, security and privacy concerns of performing an evaluation and management service by telephone and the availability of in person appointments. I also discussed with the patient that there may be a patient responsible charge related to this service. The patient expressed understanding and agreed to proceed.  The patient is at home, physician in office.   History of Present Illness: Yolanda Garrett is a 78 year old right-handed white female with a history of trigeminal neuralgia in the right V2 distribution.  The patient underwent gamma knife procedure with good improvement.  She tried to taper off of the Lyrica but when she got to the 200 mg daily dose, she had sharp jabs of pain, and she went back on 100 mg 3 times daily.  The patient has done quite well without any pain whatsoever.  The patient was noted to have some features of parkinsonism on the last evaluation.  She is walking with decreased arm swing, she reports that the right leg feels somewhat stiff, but she has not had any falls.  She has a masked face.  She reports no change in her physical capabilities, she does report that she is somewhat slow with her activities of daily living.  She has no difficulty getting up out of a chair or climbing stairs.  She is not having problems with chewing or swallowing, she can eat hot and cold foods and liquids without discomfort.   Observations/Objective: WebEx evaluation reveals that the patient is alert and cooperative.  She does appear to have masking of the face in a monotone voice.  Extraocular movements are full.  Facial symmetry is present.  She is able to protrude the tongue in the midline with good lateral movement of the tongue.  She has good finger-nose-finger and heel shin  bilaterally.  The patient is able to ambulate independently, but again has decreased arm swing bilaterally.  She is able to perform tandem gait.  Romberg is negative.  No drift is seen.  Assessment and Plan: 1.  Right V2 distribution trigeminal neuralgia.  2.  Parkinsonism  The patient will need to be followed for developing Parkinson's disease.  She is to contact me if she believes that there has been a significant decline in her functional abilities.  She will follow-up in 5 months for reevaluation.  She will continue Lyrica 100 mg 3 times daily for now.  She seems to be doing quite well with this.  She is remaining active, she walks on a regular basis.  Follow Up Instructions: 35-month follow-up with me.   I discussed the assessment and treatment plan with the patient. The patient was provided an opportunity to ask questions and all were answered. The patient agreed with the plan and demonstrated an understanding of the instructions.   The patient was advised to call back or seek an in-person evaluation if the symptoms worsen or if the condition fails to improve as anticipated.  I provided 15 minutes of non-face-to-face time during this encounter.   Kathrynn Ducking, MD

## 2019-03-28 ENCOUNTER — Telehealth: Payer: Self-pay | Admitting: Neurology

## 2019-03-28 NOTE — Telephone Encounter (Signed)
VM left x2 for pt to call back and schedule f/u

## 2019-04-06 ENCOUNTER — Other Ambulatory Visit: Payer: Medicare Other

## 2019-04-13 ENCOUNTER — Ambulatory Visit: Payer: Self-pay | Admitting: Neurology

## 2019-06-07 ENCOUNTER — Other Ambulatory Visit: Payer: Self-pay

## 2019-06-07 ENCOUNTER — Ambulatory Visit
Admission: RE | Admit: 2019-06-07 | Discharge: 2019-06-07 | Disposition: A | Discharge status: Home / Self Care | Payer: Medicare Other | Source: Ambulatory Visit | Attending: Internal Medicine | Admitting: Internal Medicine

## 2019-06-07 DIAGNOSIS — M810 Age-related osteoporosis without current pathological fracture: Secondary | ICD-10-CM

## 2019-06-19 ENCOUNTER — Other Ambulatory Visit: Payer: Self-pay | Admitting: Neurology

## 2019-09-05 ENCOUNTER — Other Ambulatory Visit: Payer: Self-pay | Admitting: Family Medicine

## 2019-09-05 DIAGNOSIS — Z1231 Encounter for screening mammogram for malignant neoplasm of breast: Secondary | ICD-10-CM

## 2019-10-22 ENCOUNTER — Ambulatory Visit
Admission: RE | Admit: 2019-10-22 | Discharge: 2019-10-22 | Disposition: A | Discharge status: Home / Self Care | Payer: Medicare Other | Source: Ambulatory Visit | Attending: Family Medicine | Admitting: Family Medicine

## 2019-10-22 ENCOUNTER — Other Ambulatory Visit: Payer: Self-pay

## 2019-10-22 DIAGNOSIS — Z1231 Encounter for screening mammogram for malignant neoplasm of breast: Secondary | ICD-10-CM

## 2019-12-10 ENCOUNTER — Telehealth: Payer: Self-pay | Admitting: Neurology

## 2019-12-10 NOTE — Telephone Encounter (Signed)
Blood work that was done on 22 November 2019 by primary care physician reveals  Glucose 84, BUN of 8, creatinine of 0.51, sodium 139, potassium 4.7, chloride of 101, CO2 23, calcium 9.6, total protein 7.4, albumin 4.5, liver profile otherwise unremarkable.  TSH of 1.37

## 2019-12-16 ENCOUNTER — Other Ambulatory Visit: Payer: Self-pay | Admitting: Neurology

## 2020-06-10 ENCOUNTER — Other Ambulatory Visit: Payer: Self-pay

## 2020-06-10 ENCOUNTER — Telehealth: Payer: Self-pay | Admitting: Neurology

## 2020-06-10 MED ORDER — PREGABALIN 100 MG PO CAPS: 100.0000 mg | ORAL_CAPSULE | Freq: Three times a day (TID) | ORAL | 0 refills | Status: DC

## 2020-06-10 NOTE — Telephone Encounter (Signed)
Refill sent to work in Dr Felecia Shelling.

## 2020-06-10 NOTE — Telephone Encounter (Signed)
Pt  Called asking for a refill on her medications, pt told due to her not being seen in over a year she would need to schedule an appointment.  Pt agreed to schedule a f/u with NP and is also on wait list.  This is FYI pt has not asked for a call back

## 2020-06-10 NOTE — Telephone Encounter (Signed)
Pt called again and stated that she is needing her pregabalin (LYRICA) 100 MG capsule filled until her appt time comes up. Pt would like it sent in to the [facility] on [address]

## 2020-06-16 ENCOUNTER — Other Ambulatory Visit: Payer: Self-pay

## 2020-06-16 ENCOUNTER — Emergency Department (HOSPITAL_COMMUNITY)
Admission: EM | Admit: 2020-06-16 | Discharge: 2020-06-16 | Disposition: A | Discharge status: Home / Self Care | Payer: Medicare Other | Attending: Emergency Medicine | Admitting: Emergency Medicine

## 2020-06-16 ENCOUNTER — Encounter (HOSPITAL_COMMUNITY): Payer: Self-pay

## 2020-06-16 DIAGNOSIS — R799 Abnormal finding of blood chemistry, unspecified: Secondary | ICD-10-CM | POA: Insufficient documentation

## 2020-06-16 DIAGNOSIS — Z5321 Procedure and treatment not carried out due to patient leaving prior to being seen by health care provider: Secondary | ICD-10-CM | POA: Insufficient documentation

## 2020-06-16 HISTORY — DX: Age-related osteoporosis without current pathological fracture: M81.0

## 2020-06-16 HISTORY — DX: Disorder of thyroid, unspecified: E07.9

## 2020-06-16 MED ORDER — SODIUM CHLORIDE 0.9% FLUSH: 3.0000 mL | Freq: Once | INTRAVENOUS | Status: DC

## 2020-06-16 NOTE — ED Triage Notes (Signed)
Patient states she went to [facility] walk in clinic yesterday for an allergic reaction. Today, patient states the physician called and told her that her Na++ level was way low. Patient does not recall the result.

## 2020-06-16 NOTE — ED Notes (Signed)
Called 3X. Eloped from waiting area.

## 2020-06-17 ENCOUNTER — Encounter: Payer: Self-pay | Admitting: Neurology

## 2020-06-17 ENCOUNTER — Emergency Department (HOSPITAL_COMMUNITY)
Admission: EM | Admit: 2020-06-17 | Discharge: 2020-06-17 | Disposition: A | Discharge status: Home / Self Care | Payer: Medicare Other | Attending: Emergency Medicine | Admitting: Emergency Medicine

## 2020-06-17 ENCOUNTER — Encounter (HOSPITAL_COMMUNITY): Payer: Self-pay | Admitting: Emergency Medicine

## 2020-06-17 DIAGNOSIS — Z79890 Hormone replacement therapy (postmenopausal): Secondary | ICD-10-CM | POA: Insufficient documentation

## 2020-06-17 DIAGNOSIS — E039 Hypothyroidism, unspecified: Secondary | ICD-10-CM | POA: Diagnosis not present

## 2020-06-17 DIAGNOSIS — E871 Hypo-osmolality and hyponatremia: Secondary | ICD-10-CM | POA: Insufficient documentation

## 2020-06-17 LAB — BASIC METABOLIC PANEL
Anion gap: 10 (ref 5–15)
BUN: 11 mg/dL (ref 8–23)
CO2: 25 mmol/L (ref 22–32)
Calcium: 9.3 mg/dL (ref 8.9–10.3)
Chloride: 97 mmol/L — ABNORMAL LOW (ref 98–111)
Creatinine, Ser: 0.59 mg/dL (ref 0.44–1.00)
GFR calc Af Amer: >60 mL/min (ref >60)
GFR calc non Af Amer: >60 mL/min (ref >60)
Glucose, Bld: 91 mg/dL (ref 70–99)
Potassium: 4.4 mmol/L (ref 3.5–5.1)
Sodium: 132 mmol/L — ABNORMAL LOW (ref 135–145)

## 2020-06-17 LAB — CBC
HCT: 43.6 % (ref 36.0–46.0)
Hemoglobin: 14.6 g/dL (ref 12.0–15.0)
MCH: 30.4 pg (ref 26.0–34.0)
MCHC: 33.5 g/dL (ref 30.0–36.0)
MCV: 90.8 fL (ref 80.0–100.0)
Platelets: 201 10*3/uL (ref 150–400)
RBC: 4.8 MIL/uL (ref 3.87–5.11)
RDW: 12.5 % (ref 11.5–15.5)
WBC: 3.1 10*3/uL — ABNORMAL LOW (ref 4.0–10.5)
nRBC: 0 % (ref 0.0–0.2)

## 2020-06-17 NOTE — Discharge Instructions (Addendum)
Your sodium was 132 today. Try to avoid drinking excessive amounts of water. Follow up with primary care provider for recheck of sodium in 1-2 weeks.

## 2020-06-17 NOTE — ED Provider Notes (Signed)
[facility] DEPT Provider Note   CSN: [id number] Arrival date & time: 06/17/20  1006     History Chief Complaint  Patient presents with  . Abnormal Lab    Yolanda Garrett is a 79 y.o. female.  79 year old female with past medical history below who presents with abnormal lab.  The patient has been following with the dentist and was recently diagnosed with a gum infection, started on antibiotics.  She states that she had an allergic reaction to the antibiotics and went to a walk-in clinic 2 days ago.  There, they gave her medication and check labs.  She was called back later and informed that her sodium was low and she needed to be evaluated.  She checked into the ED last night but left before being seen due to wait time.  She has since followed up with a dentist and has had more work done.  She denies any specific complaints including no confusion or generalized weakness.  She has never had a problem with her sodium before.  She does admit that she was drinking a lot of water previously due to her mouth infection but is now back to normal.  The history is provided by the patient.  Abnormal Lab      Past Medical History:  Diagnosis Date  . Hypothyroidism   . Osteopenia   . Osteoporosis   . Thyroid disease   . Trigeminal neuralgia    Right V2 distribution  . Trigeminal neuralgia   . Vitamin D deficiency     Patient Active Problem List   Diagnosis Date Noted  . Trigeminal neuralgia 05/09/2014  . 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 APPENDECTOMY    . BREAST EXCISIONAL BIOPSY Right   . CATARACT EXTRACTION Bilateral   . gamma knife     procedure  . TONSILLECTOMY    . TUBAL LIGATION Bilateral   . TUBAL LIGATION       OB History   No obstetric history on file.     Family History  Problem Relation Age of Onset  . Pneumonia Mother   . Diabetes Sister     Social History   Tobacco  Use  . Smoking status: Never Smoker  . Smokeless tobacco: Never Used  Vaping Use  . Vaping Use: Never used  Substance Use Topics  . Alcohol use: Never  . Drug use: Never    Home Medications Prior to Admission medications   Medication Sig Start Date End Date Taking? Authorizing Provider  Calcium Carbonate-Vitamin D (CALTRATE 600+D) 600-400 MG-UNIT per tablet Take 2 tablets by mouth daily.     [provider]  Cholecalciferol (VITAMIN D3) 2000 UNITS TABS Take 1 tablet by mouth daily.    [provider]  denosumab (PROLIA) 60 MG/ML SOLN injection Inject 60 mg into the skin every 6 (six) months. Administer in upper arm, thigh, or abdomen    [provider]  levothyroxine (SYNTHROID, LEVOTHROID) 100 MCG tablet Take 100 mcg by mouth daily before breakfast.    [provider]  pregabalin (LYRICA) 100 MG capsule Take 1 capsule (100 mg total) by mouth 3 (three) times daily. 06/10/20   Sater, Nanine Means, MD    Allergies    Amoxicillin, Codeine, Trileptal [oxcarbazepine], Ultram [tramadol], Dilantin [phenytoin sodium extended], Fosamax [alendronate sodium], and Hydromorphone  Review of Systems   Review of Systems All other systems reviewed and are negative except that which  was mentioned in HPI  Physical Exam Updated Vital Signs BP (!) 153/85 (BP Location: Left Arm)   Pulse 74   Temp 97.6 F (36.4 C) (Oral)   Resp 18   Ht 5' (1.524 m)   Wt 49 kg   SpO2 98%   BMI 21.09 kg/m   Physical Exam Vitals and nursing note reviewed.  Constitutional:      General: She is not in acute distress.    Appearance: She is well-developed.     Comments: Sitting in chair, comfortable  HENT:     Head: Normocephalic and atraumatic.  Eyes:     Conjunctiva/sclera: Conjunctivae normal.  Musculoskeletal:     Cervical back: Neck supple.  Skin:    General: Skin is warm and dry.  Neurological:     Mental Status: She is alert and oriented to person, place, and time.      Comments: Fluent speech, normal gait  Psychiatric:        Judgment: Judgment normal.     ED Results / Procedures / Treatments   Labs (all labs ordered are listed, but only abnormal results are displayed) Labs Reviewed  BASIC METABOLIC PANEL - Abnormal; Notable for the following components:      Result Value   Sodium 132 (*)    Chloride 97 (*)    All other components within normal limits  CBC - Abnormal; Notable for the following components:   WBC 3.1 (*)    All other components within normal limits    EKG None  Radiology No results found.  Procedures Procedures (including critical care time)  Medications Ordered in ED Medications - No data to display  ED Course  I have reviewed the triage vital signs and the nursing notes.  Pertinent labs that were available during my care of the patient were reviewed by me and considered in my medical decision making (see chart for details).    MDM Rules/Calculators/A&P                           Well-appearing, oriented on exam.  Sodium here is 132.  Discussed modest fluid restriction and modest increase in salt intake for the next few days to fully correct sodium but she has no other concerning symptoms today to warrant further investigation.  Instructed to follow-up with PCP for recheck. Final Clinical Impression(s) / ED Diagnoses Final diagnoses:  Hyponatremia    Rx / DC Orders ED Discharge Orders    None       Kess Mcilwain, Wenda Overland, MD 06/17/20 1420

## 2020-06-17 NOTE — ED Triage Notes (Signed)
Per pt, states she was here for the same symptoms yesterday-was told to come to ED for low Na-left due to wait-does not know lab value

## 2020-07-29 ENCOUNTER — Ambulatory Visit: Payer: Medicare Other | Admitting: Family Medicine

## 2020-07-29 ENCOUNTER — Encounter: Payer: Self-pay | Admitting: Family Medicine

## 2020-07-29 ENCOUNTER — Other Ambulatory Visit: Payer: Self-pay

## 2020-07-29 VITALS — BP 149/84 | HR 78 | Ht 60.0 in | Wt 110.0 lb

## 2020-07-29 DIAGNOSIS — G20 Parkinson's disease: Secondary | ICD-10-CM | POA: Diagnosis not present

## 2020-07-29 DIAGNOSIS — G500 Trigeminal neuralgia: Secondary | ICD-10-CM | POA: Diagnosis not present

## 2020-07-29 MED ORDER — PREGABALIN 100 MG PO CAPS: 100.0000 mg | ORAL_CAPSULE | Freq: Three times a day (TID) | ORAL | 1 refills | Status: DC

## 2020-07-29 NOTE — Patient Instructions (Signed)
We will continue Lyrica 100mg  three times daily.   Stay active. Stay well hydrated and eat regular, well balanced meals.   Follow up with Dr Jannifer Franklin in 6 months   Trigeminal Neuralgia  Trigeminal neuralgia is a nerve disorder that causes severe pain on one side of the face. The pain may last from a few seconds to several minutes. The pain is usually only on one side of the face. Symptoms may occur for days, weeks, or months and then go away for months or years. The pain may return and be worse than before. What are the causes? This condition is caused by damage or pressure to a nerve in the head that is called the trigeminal nerve. An attack can be triggered by:  Talking.  Chewing.  Putting on makeup.  Washing your face.  Shaving your face.  Brushing your teeth.  Touching your face. What increases the risk? You are more likely to develop this condition if you:  Are 80 years of age or older.  Are female. What are the signs or symptoms? The main symptom of this condition is severe pain in the:  Jaw.  Lips.  Eyes.  Nose.  Scalp.  Forehead.  Face. The pain may be:  Intense.  Stabbing.  Electric.  Shock-like. How is this diagnosed? This condition is diagnosed with a physical exam. A CT scan or an MRI may be done to rule out other conditions that can cause facial pain. How is this treated? This condition may be treated with:  Avoiding the things that trigger your symptoms.  Taking prescription medicines (anticonvulsants).  Having surgery. This may be done in severe cases if other medical treatment does not provide relief.  Having procedures such as ablation, thermal, or radiation therapy. It may take up to one month for treatment to start relieving the pain. Follow these instructions at home: Managing pain  Learn as much as you can about how to manage your pain. Ask your health care provider if a pain specialist would be helpful.  Consider talking  with a mental health care provider (psychologist) about how to cope with the pain.  Consider joining a pain support group. General instructions  Take over-the-counter and prescription medicines only as told by your health care provider.  Avoid the things that trigger your symptoms. It may help to: ? Chew on the unaffected side of your mouth. ? Avoid touching your face. ? Avoid blasts of hot or cold air.  Follow your treatment plan as told by your health care provider. This may include: ? Cognitive or behavioral therapy. ? Gentle, regular exercise. ? Meditation or yoga. ? Aromatherapy.  Keep all follow-up visits as told by your health care provider. You may need to be monitored closely to make sure treatment is working well for you. Where to find more information  Facial Pain Association: fpa-support.org Contact a health care provider if:  Your medicine is not helping your symptoms.  You have side effects from the medicine used for treatment.  You develop new, unexplained symptoms, such as: ? Double vision. ? Facial weakness. ? Facial numbness. ? Changes in hearing or balance.  You feel depressed. Get help right away if:  Your pain is severe and is not getting better.  You develop suicidal thoughts. If you ever feel like you may hurt yourself or others, or have thoughts about taking your own life, get help right away. You can go to your nearest emergency department or call:  Your local  emergency services (911 in the [place]  A suicide crisis helpline, such as the [place] at 540-797-7285. This is open 24 hours a day. Summary  Trigeminal neuralgia is a nerve disorder that causes severe pain on one side of the face. The pain may last from a few seconds to several minutes.  This condition is caused by damage or pressure to a nerve in the head that is called the trigeminal nerve.  Treatment may include avoiding the things that trigger your  symptoms, taking medicines, or having surgery or procedures. It may take up to one month for treatment to start relieving the pain.  Avoid the things that trigger your symptoms.  Keep all follow-up visits as told by your health care provider. You may need to be monitored closely to make sure treatment is working well for you. This information is not intended to replace advice given to you by your health care provider. Make sure you discuss any questions you have with your health care provider. Document Revised: 10/02/2018 Document Reviewed: 10/02/2018 Elsevier Patient Education  [place].

## 2020-07-29 NOTE — Progress Notes (Signed)
PATIENT: Yolanda Garrett [facility] DOB: 12-20-1940  REASON FOR VISIT: follow up HISTORY FROM: patient  Chief Complaint  Patient presents with  . Follow-up    rm 1 Pt here for a f/u on Trigeminal neuralgia. Pt says everything is perfect.     HISTORY OF PRESENT ILLNESS: Today 07/29/20 Yolanda Garrett is a 79 y.o. female here today for follow up for trigeminal neuralgia. She reports that she is doing great. She continues Lyrica 100mg  three times daily. She states pain has resolved. She is tolerating medication well with no obvious adverse effects.   We have been monitoring Yolanda Garrett for parkinsonism. Previous exams have revealed decreased arm swing. She feels that movements are slower, in general, due to osteoarthritis. She denies resting tremor. She denies family history of PD. She is able to manage her home. She performs all ADL's and chores independently. She helps her husband with finances and grocery shopping. She denies concerns with memory. She tries to exercise regularly. She usually walks about 1.5 miles every other day when weather permits. She denies any changes in gait. No falls. No trouble swallowing. She is seen regularly by PCP. She continues Prolia every 6 months, managed by endocrinology.   HISTORY: (copied from Dr Jannifer Franklin' note on 03/27/2019)   Yolanda Garrett is a 79 year old right-handed white female with a history of trigeminal neuralgia in the right V2 distribution.  The patient underwent gamma knife procedure with good improvement.  She tried to taper off of the Lyrica but when she got to the 200 mg daily dose, she had sharp jabs of pain, and she went back on 100 mg 3 times daily.  The patient has done quite well without any pain whatsoever.  The patient was noted to have some features of parkinsonism on the last evaluation.  She is walking with decreased arm swing, she reports that the right leg feels somewhat stiff, but she has not had any falls.  She has a masked face.  She reports no change  in her physical capabilities, she does report that she is somewhat slow with her activities of daily living.  She has no difficulty getting up out of a chair or climbing stairs.  She is not having problems with chewing or swallowing, she can eat hot and cold foods and liquids without discomfort.   REVIEW OF SYSTEMS: Out of a complete 14 system review of symptoms, the patient complains only of the following symptoms, joint stiffness, and all other reviewed systems are negative.   ALLERGIES: Allergies  Allergen Reactions  . Amoxicillin   . Codeine   . Trileptal [Oxcarbazepine]   . Ultram [Tramadol] Nausea And Vomiting  . Clindamycin/Lincomycin     Hot flashes, stomach pain and clammy skin.  . Dilantin [Phenytoin Sodium Extended]     Muscle spasms  . Fosamax [Alendronate Sodium]     Right side of jaw stiffened up per pt  . Hydromorphone Nausea Only    HOME MEDICATIONS: Outpatient Medications Prior to Visit  Medication Sig Dispense Refill  . Calcium Carbonate-Vitamin D (CALTRATE 600+D) 600-400 MG-UNIT per tablet Take 2 tablets by mouth daily.     . Cholecalciferol (VITAMIN D3) 2000 UNITS TABS Take 1 tablet by mouth daily.    Marland Kitchen denosumab (PROLIA) 60 MG/ML SOLN injection Inject 60 mg into the skin every 6 (six) months. Administer in upper arm, thigh, or abdomen    . levothyroxine (SYNTHROID, LEVOTHROID) 100 MCG tablet Take 100 mcg by mouth daily before breakfast.    .  pregabalin (LYRICA) 100 MG capsule Take 1 capsule (100 mg total) by mouth 3 (three) times daily. 270 capsule 0   No facility-administered medications prior to visit.    PAST MEDICAL HISTORY: Past Medical History:  Diagnosis Date  . Hypothyroidism   . Osteopenia   . Osteoporosis   . Thyroid disease   . Trigeminal neuralgia    Right V2 distribution  . Trigeminal neuralgia   . Vitamin D deficiency     PAST SURGICAL HISTORY: Past Surgical History:  Procedure Laterality Date  . APPENDECTOMY    . BREAST EXCISIONAL  BIOPSY Right   . CATARACT EXTRACTION Bilateral   . gamma knife     procedure  . TONSILLECTOMY    . TUBAL LIGATION Bilateral   . TUBAL LIGATION      FAMILY HISTORY: Family History  Problem Relation Age of Onset  . Pneumonia Mother   . Diabetes Sister     SOCIAL HISTORY: Social History   Socioeconomic History  . Marital status: Married    Spouse name: Mikki Santee  . Number of children: 2  . Years of education: Bachelors  . Highest education level: Not on file  Occupational History  . Occupation: Retired Pharmacist, hospital  Tobacco Use  . Smoking status: Never Smoker  . Smokeless tobacco: Never Used  Vaping Use  . Vaping Use: Never used  Substance and Sexual Activity  . Alcohol use: Never  . Drug use: Never  . Sexual activity: Not on file  Other Topics Concern  . Not on file  Social History Narrative   ** Merged History Encounter **       Patient is married Mikki Santee) and lives at home with her husband. Patient has twin sons. Patient is retired. Patient has a Haematologist. Patient is right-handed. Patient drinks three cups of de-caffeinated coffee daily.   Social Determinants of Health   Financial Resource Strain:   . Difficulty of Paying Living Expenses: Not on file  Food Insecurity:   . Worried About Charity fundraiser in the Last Year: Not on file  . Ran Out of Food in the Last Year: Not on file  Transportation Needs:   . Lack of Transportation (Medical): Not on file  . Lack of Transportation (Non-Medical): Not on file  Physical Activity:   . Days of Exercise per Week: Not on file  . Minutes of Exercise per Session: Not on file  Stress:   . Feeling of Stress : Not on file  Social Connections:   . Frequency of Communication with Friends and Family: Not on file  . Frequency of Social Gatherings with Friends and Family: Not on file  . Attends Religious Services: Not on file  . Active Member of Clubs or Organizations: Not on file  . Attends Archivist Meetings:  Not on file  . Marital Status: Not on file  Intimate Partner Violence:   . Fear of Current or Ex-Partner: Not on file  . Emotionally Abused: Not on file  . Physically Abused: Not on file  . Sexually Abused: Not on file      PHYSICAL EXAM  Vitals:   07/29/20 1452 07/29/20 1456  BP: (!) 160/89 (!) 149/84  Pulse: 80 78  Weight: 110 lb (49.9 kg)   Height: 5' (1.524 m)    Body mass index is 21.48 kg/m.  Generalized: Well developed, in no acute distress  Cardiology: normal rate and rhythm, no murmur noted Respiratory: clear to auscultation bilaterally  Neurological examination  Mentation: Alert oriented to time, place, history taking. Follows all commands speech and language fluent Cranial nerve II-XII: Pupils were equal round reactive to light. Extraocular movements were full, visual field were full on confrontational test. Facial sensation and strength were normal. Uvula tongue midline. Head turning and shoulder shrug  were normal and symmetric. Masked face noted.  Motor: The motor testing reveals 5 over 5 strength of all 4 extremities. Good symmetric motor tone is noted throughout. She has slightly dysrhythmic toe taps. Concerns for cogwheel rigidity of right upper extremity, however, patient reports motion is limited by pain. She is unable to completely relax right arm. No tremor noted.  Sensory: Sensory testing is intact to soft touch on all 4 extremities. No evidence of extinction is noted.  Coordination: Cerebellar testing reveals good finger-nose-finger and heel-to-shin bilaterally.  Gait and station: Gait is normal. Able to stand without use of arms. No assistive device used.    DIAGNOSTIC DATA (LABS, IMAGING, TESTING) - I reviewed patient records, labs, notes, testing and imaging myself where available.  No flowsheet data found.   Lab Results  Component Value Date   WBC 3.1 (L) 06/17/2020   HGB 14.6 06/17/2020   HCT 43.6 06/17/2020   MCV 90.8 06/17/2020   PLT 201  06/17/2020      Component Value Date/Time   NA 132 (L) 06/17/2020 1106   K 4.4 06/17/2020 1106   CL 97 (L) 06/17/2020 1106   CO2 25 06/17/2020 1106   GLUCOSE 91 06/17/2020 1106   BUN 11 06/17/2020 1106   CREATININE 0.59 06/17/2020 1106   CALCIUM 9.3 06/17/2020 1106   PROT 6.6 08/22/2016 2201   ALBUMIN 3.6 08/22/2016 2201   AST 26 08/22/2016 2201   ALT 27 08/22/2016 2201   ALKPHOS 128 (H) 08/22/2016 2201   BILITOT 0.4 08/22/2016 2201   GFRNONAA >60 06/17/2020 1106   GFRAA >60 06/17/2020 1106   No results found for: CHOL, HDL, LDLCALC, LDLDIRECT, TRIG, CHOLHDL No results found for: HGBA1C No results found for: VITAMINB12 No results found for: TSH     ASSESSMENT AND PLAN 79 y.o. year old female  has a past medical history of Hypothyroidism, Osteopenia, Osteoporosis, Thyroid disease, Trigeminal neuralgia, Trigeminal neuralgia, and Vitamin D deficiency. here with     ICD-10-CM   1. Parkinsonism, unspecified Parkinsonism type ([place])  G20   2. Trigeminal neuralgia  G50.0     Yolanda Garrett continues to do very well on Lyrica 100mg  TID. We will continue current treatment plan for TN. Exam does continue to raise concerns of parkinsonism. We have discussed these findings in detail. Yolanda Garrett does not feel that she has noted any changes to physical functioning and would not wish to consider a trial of Sinemet. I have educated her on symptoms to monitor for. She will call us for any new concerns. I will have her follow up in 6 months with Dr Jannifer Franklin for continued evaluation. She verbalizes understanding and agreement with this plan.    No orders of the defined types were placed in this encounter.    Meds ordered this encounter  Medications  . pregabalin (LYRICA) 100 MG capsule    Sig: Take 1 capsule (100 mg total) by mouth 3 (three) times daily.    Dispense:  270 capsule    Refill:  1    Sent to pharmacy by work in [place] until appt    Order Specific Question:   Supervising Provider    Answer:    Sarina Ill  B [[id number]]      I spent 15 minutes with the patient. 50% of this time was spent counseling and educating patient on plan of care and medications.    Debbora Presto, FNP-C 07/29/2020, 3:38 PM [facility] [address] [place], [place] [address] [phone]

## 2020-07-29 NOTE — Progress Notes (Signed)
I have read the note, and I agree with the clinical assessment and plan.  Jaysion Ramseyer K Damarea Merkel   

## 2020-09-09 ENCOUNTER — Other Ambulatory Visit: Payer: Self-pay | Admitting: Family Medicine

## 2020-09-09 DIAGNOSIS — Z1231 Encounter for screening mammogram for malignant neoplasm of breast: Secondary | ICD-10-CM

## 2020-10-28 ENCOUNTER — Ambulatory Visit
Admission: RE | Admit: 2020-10-28 | Discharge: 2020-10-28 | Disposition: A | Discharge status: Home / Self Care | Payer: Medicare Other | Source: Ambulatory Visit | Attending: Family Medicine | Admitting: Family Medicine

## 2020-10-28 ENCOUNTER — Other Ambulatory Visit: Payer: Self-pay

## 2020-10-28 DIAGNOSIS — Z1231 Encounter for screening mammogram for malignant neoplasm of breast: Secondary | ICD-10-CM

## 2020-12-01 DIAGNOSIS — R29898 Other symptoms and signs involving the musculoskeletal system: Secondary | ICD-10-CM | POA: Diagnosis not present

## 2020-12-01 DIAGNOSIS — Z0000 Encounter for general adult medical examination without abnormal findings: Secondary | ICD-10-CM | POA: Diagnosis not present

## 2020-12-01 DIAGNOSIS — M810 Age-related osteoporosis without current pathological fracture: Secondary | ICD-10-CM | POA: Diagnosis not present

## 2020-12-01 DIAGNOSIS — G500 Trigeminal neuralgia: Secondary | ICD-10-CM | POA: Diagnosis not present

## 2020-12-01 DIAGNOSIS — E039 Hypothyroidism, unspecified: Secondary | ICD-10-CM | POA: Diagnosis not present

## 2021-01-26 ENCOUNTER — Other Ambulatory Visit: Payer: Self-pay

## 2021-01-26 ENCOUNTER — Encounter: Payer: Self-pay | Admitting: Neurology

## 2021-01-26 ENCOUNTER — Ambulatory Visit: Payer: Medicare Other | Admitting: Family Medicine

## 2021-01-26 ENCOUNTER — Ambulatory Visit: Payer: Medicare Other | Admitting: Neurology

## 2021-01-26 VITALS — BP 171/84 | HR 95 | Ht 60.0 in | Wt 108.4 lb

## 2021-01-26 DIAGNOSIS — G500 Trigeminal neuralgia: Secondary | ICD-10-CM | POA: Diagnosis not present

## 2021-01-26 MED ORDER — PREGABALIN 100 MG PO CAPS: 100.0000 mg | ORAL_CAPSULE | Freq: Three times a day (TID) | ORAL | 1 refills | Status: DC

## 2021-01-26 NOTE — Progress Notes (Signed)
Reason for visit: Trigeminal neuralgia, parkinsonism  Yolanda Garrett is an 80 y.o. female  History of present illness:  Yolanda Garrett is an 80 year old right-handed white female with a history of trigeminal neuralgia on the right side in the V2 distribution. The patient has responded quite well to a combination of a gamma knife procedure and ongoing Lyrica therapy. The patient takes 100 mg 3 times daily. She is tolerating the medication quite well. She has been followed over time for developing signs of parkinsonism. She has been noted to have decreased arm swing, masked face, and vertical gaze paresis. She has no tremor. She reports no alteration whatsoever in her ability to ambulate. She tries to stay active, she will usually walk about a mile a day. She denies any falls. She has not had any alteration in her speech or swallowing. She denies any drooling. She returns to this office for further evaluation.  Past Medical History:  Diagnosis Date  . Hypothyroidism   . Osteopenia   . Osteoporosis   . Thyroid disease   . Trigeminal neuralgia    Right V2 distribution  . Trigeminal neuralgia   . Vitamin D deficiency     Past Surgical History:  Procedure Laterality Date  . APPENDECTOMY    . BREAST EXCISIONAL BIOPSY Right   . CATARACT EXTRACTION Bilateral   . gamma knife     procedure  . TONSILLECTOMY    . TUBAL LIGATION Bilateral   . TUBAL LIGATION      Family History  Problem Relation Age of Onset  . Pneumonia Mother   . Diabetes Sister     Social history:  reports that she has never smoked. She has never used smokeless tobacco. She reports that she does not drink alcohol and does not use drugs.    Allergies  Allergen Reactions  . Amoxicillin   . Codeine   . Trileptal [Oxcarbazepine]   . Ultram [Tramadol] Nausea And Vomiting  . Clindamycin/Lincomycin     Hot flashes, stomach pain and clammy skin.  . Dilantin [Phenytoin Sodium Extended]     Muscle spasms  . Fosamax  [Alendronate Sodium]     Right side of jaw stiffened up per pt  . Hydromorphone Nausea Only    Medications:  Prior to Admission medications   Medication Sig Start Date End Date Taking? Authorizing Provider  Calcium Carbonate-Vitamin D 600-400 MG-UNIT tablet Take 2 tablets by mouth daily.    Yes [provider]  Cholecalciferol (VITAMIN D3) 2000 UNITS TABS Take 1 tablet by mouth daily.   Yes [provider]  denosumab (PROLIA) 60 MG/ML SOLN injection Inject 60 mg into the skin every 6 (six) months. Administer in upper arm, thigh, or abdomen   Yes [provider]  levothyroxine (SYNTHROID, LEVOTHROID) 100 MCG tablet Take 100 mcg by mouth daily before breakfast.   Yes [provider]  pregabalin (LYRICA) 100 MG capsule Take 1 capsule (100 mg total) by mouth 3 (three) times daily. 07/29/20  Yes Lomax, Amy, NP    ROS:  Out of a complete 14 system review of symptoms, the patient complains only of the following symptoms, and all other reviewed systems are negative.  Facial pain Hard of hearing   Blood pressure (!) 171/84, pulse 95, height 5' (1.524 m), weight 108 lb 6.4 oz (49.2 kg).  Physical Exam  General: The patient is alert and cooperative at the time of the examination.  Skin: No significant peripheral edema is noted.  Neurologic Exam  Mental status: The patient is alert and oriented x 3 at the time of the examination. The patient has apparent normal recent and remote memory, with an apparently normal attention span and concentration ability.   Cranial nerves: Facial symmetry is present. Speech is normal, no aphasia or dysarthria is noted. Extraocular movements are full. Visual fields are full, with exception of some paresis of superior gaze. Masking the face is seen.  Motor: The patient has good strength in all 4 extremities.  Sensory examination: Soft touch sensation is symmetric on the face, arms, and legs.  Coordination: The patient  has good finger-nose-finger and heel-to-shin bilaterally.  Gait and station: The patient has the ability to arise from a seated position with arms crossed. Once up, she can walk independently, she has relatively good stride and good turns but significant decrease in arm swing bilaterally in a symmetric fashion. A slightly stooped posture is noted. Tandem gait is slightly unsteady. Romberg is negative.  Reflexes: Deep tendon reflexes are symmetric.   Assessment/Plan:  1. Right V2 distribution trigeminal neuralgia, well controlled  2. Parkinsonism  The patient does have features of Parkinson's disease but she indicates that her symptoms are quite stable over time. We will continue to watch this issue without initiation of medical therapy. The patient will continue the Lyrica taking 100 mg 3 times daily. The patient was given a prescription for the medication, she will follow up here in 6 months.  Jill Alexanders MD 01/26/2021 8:11 AM  [facility] [address] [place], [place] [address]  Phone [phone] Fax [phone]

## 2021-02-03 DIAGNOSIS — E039 Hypothyroidism, unspecified: Secondary | ICD-10-CM | POA: Diagnosis not present

## 2021-02-03 DIAGNOSIS — M810 Age-related osteoporosis without current pathological fracture: Secondary | ICD-10-CM | POA: Diagnosis not present

## 2021-02-04 DIAGNOSIS — M810 Age-related osteoporosis without current pathological fracture: Secondary | ICD-10-CM | POA: Diagnosis not present

## 2021-02-04 DIAGNOSIS — E039 Hypothyroidism, unspecified: Secondary | ICD-10-CM | POA: Diagnosis not present

## 2021-02-11 DIAGNOSIS — Z6821 Body mass index (BMI) 21.0-21.9, adult: Secondary | ICD-10-CM | POA: Diagnosis not present

## 2021-02-11 DIAGNOSIS — E039 Hypothyroidism, unspecified: Secondary | ICD-10-CM | POA: Diagnosis not present

## 2021-02-11 DIAGNOSIS — M810 Age-related osteoporosis without current pathological fracture: Secondary | ICD-10-CM | POA: Diagnosis not present

## 2021-02-11 DIAGNOSIS — Z8669 Personal history of other diseases of the nervous system and sense organs: Secondary | ICD-10-CM | POA: Diagnosis not present

## 2021-02-24 DIAGNOSIS — M810 Age-related osteoporosis without current pathological fracture: Secondary | ICD-10-CM | POA: Diagnosis not present

## 2021-07-21 ENCOUNTER — Other Ambulatory Visit: Payer: Self-pay | Admitting: Neurology

## 2021-07-21 MED ORDER — PREGABALIN 100 MG PO CAPS: 100.0000 mg | ORAL_CAPSULE | Freq: Three times a day (TID) | ORAL | 1 refills | Status: DC

## 2021-07-21 MED ORDER — PREGABALIN 50 MG PO CAPS: ORAL_CAPSULE | ORAL | 2 refills | Status: DC

## 2021-07-30 ENCOUNTER — Encounter: Payer: Self-pay | Admitting: Neurology

## 2021-07-30 ENCOUNTER — Ambulatory Visit: Payer: Medicare Other | Admitting: Neurology

## 2021-07-30 VITALS — BP 118/80 | HR 76 | Ht 60.0 in | Wt 108.2 lb

## 2021-07-30 DIAGNOSIS — G500 Trigeminal neuralgia: Secondary | ICD-10-CM | POA: Diagnosis not present

## 2021-07-30 DIAGNOSIS — G20A1 Parkinson's disease without dyskinesia, without mention of fluctuations: Secondary | ICD-10-CM

## 2021-07-30 DIAGNOSIS — G2581 Restless legs syndrome: Secondary | ICD-10-CM | POA: Diagnosis not present

## 2021-07-30 DIAGNOSIS — G20 Parkinson's disease: Secondary | ICD-10-CM

## 2021-07-30 HISTORY — DX: Parkinson's disease: G20

## 2021-07-30 HISTORY — DX: Parkinson's disease without dyskinesia, without mention of fluctuations: G20.A1

## 2021-07-30 HISTORY — DX: Restless legs syndrome: G25.81

## 2021-07-30 MED ORDER — CARBIDOPA-LEVODOPA 25-100 MG PO TABS: ORAL_TABLET | ORAL | 3 refills | Status: DC

## 2021-07-30 NOTE — Patient Instructions (Signed)
We will start Sinemet for the parkinson's disease.  Sinemet (carbidopa) may result in confusion or hallucinations, drowsiness, nausea, or dizziness. If any significant side effects are noted, please contact our office. Sinemet may not be well absorbed when taken with high protein meals, if tolerated it is best to take 30-45 minutes before you eat.

## 2021-07-30 NOTE — Progress Notes (Signed)
Reason for visit: Right V2 trigeminal neuralgia, Parkinson's disease  Yolanda Garrett is an 80 y.o. female  History of present illness:  Yolanda Garrett is an 79 year old right-handed white female with a history of right V2 trigeminal neuralgia that required a gamma knife procedure previously.  The patient has been fairly well maintained with her pain level on Lyrica taking 100 mg twice daily.  Over the last several weeks, she has started to have twinges of pain in the right face.  She has some residual numbness in the right face following the gamma knife procedure.  She has recently been increased on the Lyrica taking 150 mg twice daily.  She reports some drowsiness and spaciness on this medication but she is starting to get acclimated to the dose increase.  The patient is able to chew and swallow and brush her teeth without difficulty.  The patient in the past has been noted to have a features of parkinsonism, but her mobility has been quite good, medical therapy was not initiated.  Over the last several months, she has noted some alteration in her mobility, activities of daily living are taking longer to do.  She has not had any falls.  She sometimes will shuffle when she makes a turn.  She is becoming stooped and is leaning a bit to the right.  She has not noted any tremors.  She denies any problems with choking with swallowing or any drooling.  She does report that she has a dry mouth.  She recently had some problems with sciatica involving the left leg, she is getting physical therapy for this and an epidural steroid injection is planned in the future.  She has recently noted a restless leg type feeling at night, she is not sleeping well because of this.  The patient returns the office today for an evaluation.  Past Medical History:  Diagnosis Date   Hypothyroidism    Osteopenia    Osteoporosis    Thyroid disease    Trigeminal neuralgia    Right V2 distribution   Trigeminal neuralgia    Vitamin  D deficiency     Past Surgical History:  Procedure Laterality Date   APPENDECTOMY     BREAST EXCISIONAL BIOPSY Right    CATARACT EXTRACTION Bilateral    gamma knife     procedure   TONSILLECTOMY     TUBAL LIGATION Bilateral    TUBAL LIGATION      Family History  Problem Relation Age of Onset   Pneumonia Mother    Diabetes Sister     Social history:  reports that she has never smoked. She has never used smokeless tobacco. She reports that she does not drink alcohol and does not use drugs.    Allergies  Allergen Reactions   Amoxicillin    Codeine    Trileptal [Oxcarbazepine]    Ultram [Tramadol] Nausea And Vomiting   Clindamycin/Lincomycin     Hot flashes, stomach pain and clammy skin.   Dilantin [Phenytoin Sodium Extended]     Muscle spasms   Fosamax [Alendronate Sodium]     Right side of jaw stiffened up per pt   Hydromorphone Nausea Only    Medications:  Prior to Admission medications   Medication Sig Start Date End Date Taking? Authorizing Provider  Calcium Carbonate-Vitamin D 600-400 MG-UNIT tablet Take 2 tablets by mouth daily.     [provider]  Cholecalciferol (VITAMIN D3) 2000 UNITS TABS Take 1 tablet by mouth daily.  [provider]  denosumab (PROLIA) 60 MG/ML SOLN injection Inject 60 mg into the skin every 6 (six) months. Administer in upper arm, thigh, or abdomen    [provider]  levothyroxine (SYNTHROID, LEVOTHROID) 100 MCG tablet Take 100 mcg by mouth daily before breakfast.    [provider]  pregabalin (LYRICA) 100 MG capsule Take 1 capsule (100 mg total) by mouth 3 (three) times daily. 07/21/21   Kathrynn Ducking, MD  pregabalin (LYRICA) 50 MG capsule 1 capsule in the evening for 1 week, then take 1 in the morning and 1 in the evening 07/21/21   Kathrynn Ducking, MD    ROS:  Out of a complete 14 system review of symptoms, the patient complains only of the following symptoms, and all other reviewed systems  are negative.  Walking difficulty Left leg pain Restless legs Right face pain  There were no vitals taken for this visit.  Physical Exam  General: The patient is alert and cooperative at the time of the examination.  Skin: No significant peripheral edema is noted.   Neurologic Exam  Mental status: The patient is alert and oriented x 3 at the time of the examination. The patient has apparent normal recent and remote memory, with an apparently normal attention span and concentration ability.   Cranial nerves: Facial symmetry is present. Speech is normal, no aphasia or dysarthria is noted. Extraocular movements are full. Visual fields are full.  Prominent masking the face is seen.  Motor: The patient has good strength in all 4 extremities.  Sensory examination: Soft touch sensation is symmetric on the face, arms, and legs.  Coordination: The patient has good finger-nose-finger and heel-to-shin bilaterally.  Gait and station: The patient is able to arise from a seated position with arms crossed.  Once up, the patient has a stooped posture, rotated slightly to the right.  The patient is able to walk independently, she has markedly decreased arm swing bilaterally with slightly flexed arms.  She may shuffle occasionally when she makes turns.  Tandem gait is slightly unsteady.  Romberg is negative.  Reflexes: Deep tendon reflexes are symmetric.   Assessment/Plan:  1.  Parkinson's disease  2.  Right V2 trigeminal neuralgia  The patient will be started on Sinemet 25/100 mg tablets, she will start 1/2 tablet 3 times a day for 4 weeks and then go to 1 full tablet 3 times daily.  She will remain on Lyrica 150 mg twice daily, she will call for any dose adjustments in this regard.  She will follow-up in 4 months, in the future she can be seen through Dr. Jaynee Eagles.  Jill Alexanders MD 07/30/2021 9:47 AM  [facility] [address] [place], [place]  [address]  Phone [phone] Fax [phone]

## 2021-08-10 ENCOUNTER — Other Ambulatory Visit: Payer: Self-pay | Admitting: Neurology

## 2021-08-10 MED ORDER — PREGABALIN 50 MG PO CAPS: 50.0000 mg | ORAL_CAPSULE | Freq: Three times a day (TID) | ORAL | 0 refills | Status: DC

## 2021-08-24 ENCOUNTER — Other Ambulatory Visit: Payer: Self-pay | Admitting: Neurology

## 2021-08-24 DIAGNOSIS — G500 Trigeminal neuralgia: Secondary | ICD-10-CM

## 2021-08-25 ENCOUNTER — Telehealth: Payer: Self-pay

## 2021-08-25 NOTE — Telephone Encounter (Signed)
Per Dr. Jannifer Franklin   If possible, would like to see the patient sometime in October as she is not doing well.    I called the pt and left a vm asking for a call back to assist with scheduling her appt.  If pt calls back please offer 08/26/2021 at 12 pm. This would be a double booked slot and I can open if pt accepts.

## 2021-08-26 ENCOUNTER — Ambulatory Visit: Payer: Medicare Other | Admitting: Neurology

## 2021-08-26 ENCOUNTER — Encounter: Payer: Self-pay | Admitting: Neurology

## 2021-08-26 VITALS — BP 146/83 | HR 71 | Ht 60.0 in | Wt 107.0 lb

## 2021-08-26 DIAGNOSIS — G500 Trigeminal neuralgia: Secondary | ICD-10-CM

## 2021-08-26 MED ORDER — LAMOTRIGINE 25 MG PO TABS: ORAL_TABLET | ORAL | 1 refills | Status: DC

## 2021-08-26 MED ORDER — PREGABALIN 150 MG PO CAPS: 150.0000 mg | ORAL_CAPSULE | Freq: Three times a day (TID) | ORAL | 1 refills | Status: DC

## 2021-08-26 NOTE — Progress Notes (Signed)
Reason for visit: Parkinson's disease, trigeminal neuralgia  Yolanda Garrett is an 80 y.o. female  History of present illness:  Yolanda Garrett is an 80 year old right-handed white female with a history of trigeminal neuralgia involving the right V2 distribution.  The patient also has developed signs of parkinsonism, she was placed on Sinemet on her last visit but never took the medication.  She has had some worsening of her neuralgia pain, she has gone up on the Lyrica to 150 mg three times daily but is having side effects with drowsiness and some gait instability on the medication at that level.  The patient has scoliosis in the back, she tends to lean to the right.  She indicates that she is able to brush her teeth, but she has had to pure her food in order to get in nutrition.  She has had a gamma knife procedure for her neuralgia pain on 2 occasions, she has been referred back for another evaluation.  The patient has not had any falls, she feels somewhat unsteady.  She returns to this office for an evaluation.  Past Medical History:  Diagnosis Date   Hypothyroidism    Osteopenia    Osteoporosis    Parkinson's disease ([place]) 07/30/2021   RLS (restless legs syndrome) 07/30/2021   Thyroid disease    Trigeminal neuralgia    Right V2 distribution   Trigeminal neuralgia    Vitamin D deficiency     Past Surgical History:  Procedure Laterality Date   APPENDECTOMY     BREAST EXCISIONAL BIOPSY Right    CATARACT EXTRACTION Bilateral    gamma knife     procedure   TONSILLECTOMY     TUBAL LIGATION Bilateral    TUBAL LIGATION      Family History  Problem Relation Age of Onset   Pneumonia Mother    Diabetes Sister     Social history:  reports that she has never smoked. She has never used smokeless tobacco. She reports that she does not drink alcohol and does not use drugs.    Allergies  Allergen Reactions   Amoxicillin    Codeine    Trileptal [Oxcarbazepine]    Ultram [Tramadol]  Nausea And Vomiting   Clindamycin/Lincomycin     Hot flashes, stomach pain and clammy skin.   Dilantin [Phenytoin Sodium Extended]     Muscle spasms   Fosamax [Alendronate Sodium]     Right side of jaw stiffened up per pt   Hydromorphone Nausea Only    Medications:  Prior to Admission medications   Medication Sig Start Date End Date Taking? Authorizing Provider  Calcium Carbonate-Vitamin D 600-400 MG-UNIT tablet Take 2 tablets by mouth daily.    Yes [provider]  Cholecalciferol (VITAMIN D3) 2000 UNITS TABS Take 1 tablet by mouth daily.   Yes [provider]  denosumab (PROLIA) 60 MG/ML SOLN injection Inject 60 mg into the skin every 6 (six) months. Administer in upper arm, thigh, or abdomen   Yes [provider]  levothyroxine (SYNTHROID, LEVOTHROID) 100 MCG tablet Take 100 mcg by mouth daily before breakfast.   Yes [provider]  pregabalin (LYRICA) 100 MG capsule Take 1 capsule (100 mg total) by mouth 3 (three) times daily. 07/21/21  Yes Kathrynn Ducking, MD  pregabalin (LYRICA) 50 MG capsule Take 1 capsule (50 mg total) by mouth 3 (three) times daily. 08/10/21  Yes Kathrynn Ducking, MD  carbidopa-levodopa (SINEMET IR) 25-100 MG tablet 1/2 tablet three  times a day for 4 weeks, then take 1 tablet three times a day Patient not taking: Reported on 08/26/2021 07/30/21   Kathrynn Ducking, MD    ROS:  Out of a complete 14 system review of symptoms, the patient complains only of the following symptoms, and all other reviewed systems are negative.  Facial pain Walking difficulty  Blood pressure (!) 146/83, pulse 71, height 5' (1.524 m), weight 107 lb (48.5 kg).  Physical Exam  General: The patient is alert and cooperative at the time of the examination.  Skin: No significant peripheral edema is noted.   Neurologic Exam  Mental status: The patient is alert and oriented x 3 at the time of the examination. The patient has apparent normal recent  and remote memory, with an apparently normal attention span and concentration ability.   Cranial nerves: Facial symmetry is present. Speech is normal, no aphasia or dysarthria is noted. Extraocular movements are full. Visual fields are full.  Masking of the face is seen.  Motor: The patient has good strength in all 4 extremities.  Sensory examination: Soft touch sensation is symmetric on the face, arms, and legs.  Coordination: The patient has good finger-nose-finger and heel-to-shin bilaterally.  Gait and station: The patient has the ability to rise from a seated position with arms crossed.  Once up, the patient is able to walk independently, she does have some shuffling of the feet, decreased arm swing bilaterally.  Tandem gait is unsteady.  Romberg is negative.  Reflexes: Deep tendon reflexes are symmetric.   Assessment/Plan:  1.  Parkinson's disease  2.  Trigeminal neuralgia, right V2 distribution  The patient will be placed on lamotrigine for her neuralgia pain.  She will hold off on taking the carbidopa until she is up on a maintenance dose of the Lamictal.  A prescription was sent in for the Lyrica for the 150 mg capsules taking 1 capsule 3 times daily.  She has had a referral to [facility] for gamma knife evaluation.  She will follow-up in the future with Dr. Jaynee Eagles.  Jill Alexanders MD 08/26/2021 12:07 PM  [facility] [address] [place], [place] [address]  Phone [phone] Fax [phone]

## 2021-08-31 ENCOUNTER — Telehealth: Payer: Self-pay | Admitting: *Deleted

## 2021-08-31 ENCOUNTER — Telehealth: Payer: Self-pay | Admitting: Neurology

## 2021-08-31 MED ORDER — LAMOTRIGINE 25 MG PO TABS: ORAL_TABLET | ORAL | 1 refills | Status: DC

## 2021-08-31 NOTE — Telephone Encounter (Signed)
From my chart: I contacted [facility] and they said they needed more clarification before they can fill the request for both.  Called [facility], phone never answered and call was disconnected, tried again with same outcome. Will call back later.

## 2021-08-31 NOTE — Telephone Encounter (Signed)
I resent the prescription after corrections were made.  She goes up on the medication every 2 weeks.

## 2021-08-31 NOTE — Telephone Encounter (Signed)
Referral for gamma knife procedure sent to [facility] for scheduling. They will call patient. Phone: [phone].

## 2021-08-31 NOTE — Addendum Note (Signed)
Addended by: Kathrynn Ducking on: 08/31/2021 04:59 PM   Modules accepted: Orders

## 2021-08-31 NOTE — Telephone Encounter (Signed)
Called [facility], spoke with Anderson Malta who stated the lamotrigine directions need clarification re: take 2 tabs twice a day for how long before taking 3 tabs twice a day.. I advised will ask MD. She requested they be called back with directions, verbalized understanding, appreciation.

## 2021-09-17 ENCOUNTER — Other Ambulatory Visit: Payer: Self-pay | Admitting: Family Medicine

## 2021-09-17 DIAGNOSIS — Z1231 Encounter for screening mammogram for malignant neoplasm of breast: Secondary | ICD-10-CM

## 2021-10-07 ENCOUNTER — Ambulatory Visit (INDEPENDENT_AMBULATORY_CARE_PROVIDER_SITE_OTHER): Payer: Medicare Other

## 2021-10-07 ENCOUNTER — Other Ambulatory Visit: Payer: Self-pay

## 2021-10-07 ENCOUNTER — Ambulatory Visit: Payer: Medicare Other | Admitting: Podiatry

## 2021-10-07 ENCOUNTER — Encounter: Payer: Self-pay | Admitting: Podiatry

## 2021-10-07 DIAGNOSIS — D3613 Benign neoplasm of peripheral nerves and autonomic nervous system of lower limb, including hip: Secondary | ICD-10-CM

## 2021-10-07 DIAGNOSIS — M21619 Bunion of unspecified foot: Secondary | ICD-10-CM | POA: Diagnosis not present

## 2021-10-07 DIAGNOSIS — M84375A Stress fracture, left foot, initial encounter for fracture: Secondary | ICD-10-CM | POA: Diagnosis not present

## 2021-10-07 NOTE — Progress Notes (Signed)
Subjective:   Patient ID: Yolanda Garrett, female   DOB: 80 y.o.   MRN: [id number]   HPI Patient presents stating she has been trying to be more active and has developed severe pain on the top of the left foot of approximate 3 weeks duration and she has not been able to be active.  States she feels like she is walking on a broken bone and its been swollen and she gets sharp pains when she tries to walk.  Patient does not smoke likes to be active   Review of Systems  All other systems reviewed and are negative.      Objective:  Physical Exam Vitals and nursing note reviewed.  Constitutional:      Appearance: She is well-developed.  Pulmonary:     Effort: Pulmonary effort is normal.  Musculoskeletal:        General: Normal range of motion.  Skin:    General: Skin is warm.  Neurological:     Mental Status: She is alert.    Neurovascular status intact muscle strength found to be adequate range of motion is adequate negative Bevelyn Buckles' sign noted.  Forefoot edema +1 pitting left with exquisite discomfort third metatarsal shaft with inflammation of the joint and mild varicosities.  Patient is found to have good digital perfusion well oriented x3     Assessment:  Inflammatory process left with possibility of stress fracture or other unknown bone condition H     Plan:  NP x-rays reviewed and at this point I recommended immobilization with boot elevation compression ice therapy.  This should hopefully heal uneventfully but she does have a fracture and it was probably due to increased activity and hopefully will be a one-time event  X-rays indicate that there is a fracture of the third metatarsal shaft left that is in a mid healing position

## 2021-10-12 MED ORDER — CARBIDOPA-LEVODOPA 25-100 MG PO TABS: ORAL_TABLET | ORAL | 1 refills | Status: DC

## 2021-10-13 ENCOUNTER — Telehealth: Payer: Self-pay | Admitting: *Deleted

## 2021-10-13 NOTE — Telephone Encounter (Signed)
Pt returned phone call, would like a call back.  

## 2021-10-13 NOTE — Telephone Encounter (Signed)
LMVM for pt to return call.   

## 2021-10-13 NOTE — Telephone Encounter (Signed)
I called pt LMVM home # to call me back to go over Dr. Tobey Grim note re: lamotrigine.

## 2021-10-14 ENCOUNTER — Telehealth: Payer: Self-pay | Admitting: *Deleted

## 2021-10-14 NOTE — Telephone Encounter (Signed)
I called  pt, spoke to both wife and husband on speaker phone.  She is taking the carbidopa levo 25/100 1 tablet TID and the pregabalin 150mg  po TID at this time.  She never did get or start the lamotrigine.  I relayed Dr. Tobey Grim last note instructions.  The lam[facility]ine is at the [facility].  (Titration to 150mg  po daily when starts -per instructions).  My question is she to taper the carbidopa to titrate the lamotrigine.  Please advise.  Dr Jannifer Franklin and Dr Jaynee Eagles out.

## 2021-10-14 NOTE — Telephone Encounter (Signed)
Pt has appt tomorrow with Dr. Billey Gosling.

## 2021-10-14 NOTE — Telephone Encounter (Signed)
Pt has appt with Dr. Billey Gosling tomorrow.

## 2021-10-15 ENCOUNTER — Encounter: Payer: Self-pay | Admitting: Psychiatry

## 2021-10-15 ENCOUNTER — Ambulatory Visit: Payer: Medicare Other | Admitting: Psychiatry

## 2021-10-15 VITALS — BP 154/84 | HR 76 | Ht 60.0 in | Wt 108.0 lb

## 2021-10-15 DIAGNOSIS — G20 Parkinson's disease: Secondary | ICD-10-CM | POA: Diagnosis not present

## 2021-10-15 DIAGNOSIS — G500 Trigeminal neuralgia: Secondary | ICD-10-CM

## 2021-10-15 NOTE — Progress Notes (Signed)
   CC:  trigeminal neuralgia  Follow-up Visit  Last visit: 08/26/21  Brief HPI: 80 year old female who previously followed with Dr. Jannifer Franklin for trigeminal neuralgia. Underwent gamma knife x2. Was noted to have Parkinsons symptoms at her last visit and was started on Sinemet.  Interval History: Trigemina nerve pain returned in September 2022. She is taking Lyrica 150 mg TID which initially helped her symptoms but now they are starting to return. She is having breakthrough pain almost every day. Has pain when eating, chewing, or touching her face. When she gets a flare it can last the entire night. Never picked up Lamictal as she wanted to discuss it in the office first.  She is scheduled for repeat gamma knife on December 13th.  She has been taking Sinemet 1 mg TID and has not noticed much of a difference in Parkinson's symptoms. Husband thinks her foot has been shaking less. Currently doesn't feel her symptoms are particularly bothersome.   Current Regimen: Preventative: Lyrica 150 mg TID  Prior Therapies                                  Lyrica 150 mg TID Carbamazepine  Trileptal Dilantin  Physical Exam:   Vital Signs: BP (!) 154/84   Pulse 76   Ht 5' (1.524 m)   Wt 108 lb (49 kg)   BMI 21.09 kg/m  GENERAL:  well appearing, in no acute distress, alert  SKIN:  Color, texture, turgor normal. No rashes or lesions HEAD:  Normocephalic/atraumatic. RESP: normal respiratory effort MSK:  No gross joint deformities.   NEUROLOGICAL: Mental Status: Alert, oriented to person, place and time, Follows commands, and Speech fluent and appropriate. Masked facies Cranial Nerves: PERRL, face symmetric, no dysarthria, hearing grossly intact Motor: moves all extremities equally +cogwheeling RUE. No tremor  Gait: Gait exam deferred due to fractured left foot  IMPRESSION: 80 year old female who presents for follow up of trigeminal neuralgia and Parkinson's disease. She is currently having  breakthrough pain almost every day. Discussed side effects of Lamictal. She will start the medication today. Gamma knife is scheduled for next month. Parkinson's symptoms are mild and currently not bothersome to her. Will continue current dose of Sinemet for now.  PLAN: -Start Lamictal 25 mg BID, uptitrate to 75 mg BID -Continue Sinemet 25-100 mg 1 tab TID -next steps: Consider SNRI, gabapentin, vimpat, botox for TN pain prevention. Consider baclofen PRN for breakthrough pain.  Follow-up: 3 months  I spent a total of 36 minutes on the date of the service. Discussed medication side effects, adverse reactions and drug interactions. Written educational materials and patient instructions outlining all of the above were given.  Genia Harold, MD

## 2021-10-28 ENCOUNTER — Ambulatory Visit: Payer: Medicare Other | Admitting: Podiatry

## 2021-10-28 ENCOUNTER — Encounter: Payer: Self-pay | Admitting: Podiatry

## 2021-10-28 ENCOUNTER — Other Ambulatory Visit: Payer: Self-pay

## 2021-10-28 DIAGNOSIS — M84375A Stress fracture, left foot, initial encounter for fracture: Secondary | ICD-10-CM

## 2021-10-28 NOTE — Progress Notes (Signed)
Subjective:   Patient ID: Yolanda Garrett, female   DOB: 80 y.o.   MRN: [id number]   HPI Patient presents stating her foot is feeling a lot better and while she still has some swelling she is improving and has been wearing her boot all the time   ROS      Objective:  Physical Exam  Neurovascular status intact patient's left forefoot is improving with mild swelling still noted but quite a bit better with discomfort in the mid shaft left that is improving quite a bit     Assessment:  Improved from stress fracture left third metatarsal shaft     Plan:  Reviewed with her and her husband gradual reduction of the boot over the next 3 weeks and that it is normal to have mild discomfort over the next 8 to 12 weeks and I did discuss with her that she can increase activities but slowly and that some degree of discomfort is normal.  Patient also was explained the chances for fracture of the adjacent metatarsals and to be careful and is encouraged to call us with any questions concerns which may arise

## 2021-11-05 ENCOUNTER — Ambulatory Visit
Admission: RE | Admit: 2021-11-05 | Discharge: 2021-11-05 | Disposition: A | Discharge status: Home / Self Care | Payer: Medicare Other | Source: Ambulatory Visit | Attending: Family Medicine | Admitting: Family Medicine

## 2021-11-05 DIAGNOSIS — Z1231 Encounter for screening mammogram for malignant neoplasm of breast: Secondary | ICD-10-CM

## 2021-11-11 ENCOUNTER — Encounter: Payer: Self-pay | Admitting: Psychiatry

## 2021-12-16 ENCOUNTER — Ambulatory Visit: Payer: Medicare Other | Admitting: Neurology

## 2021-12-28 ENCOUNTER — Encounter: Payer: Self-pay | Admitting: Psychiatry

## 2021-12-28 ENCOUNTER — Other Ambulatory Visit: Payer: Self-pay | Admitting: *Deleted

## 2021-12-28 MED ORDER — CARBIDOPA-LEVODOPA 25-100 MG PO TABS: ORAL_TABLET | ORAL | 2 refills | Status: DC

## 2022-01-04 ENCOUNTER — Encounter: Payer: Self-pay | Admitting: Psychiatry

## 2022-01-18 ENCOUNTER — Ambulatory Visit: Payer: Medicare Other | Admitting: Psychiatry

## 2022-01-18 VITALS — BP 125/76 | HR 71 | Ht 60.0 in | Wt 106.0 lb

## 2022-01-18 DIAGNOSIS — G20 Parkinson's disease: Secondary | ICD-10-CM

## 2022-01-18 DIAGNOSIS — G500 Trigeminal neuralgia: Secondary | ICD-10-CM

## 2022-01-18 MED ORDER — PREGABALIN 150 MG PO CAPS: 150.0000 mg | ORAL_CAPSULE | Freq: Three times a day (TID) | ORAL | 11 refills | Status: DC

## 2022-01-18 NOTE — Progress Notes (Signed)
° °  CC:  trigeminal neuralgia  Follow-up Visit  Last visit: 10/15/21  Brief HPI: 81 year old female with a history of hypothyroidism who follows in clinic for right trigeminal neuralgia and Parkinson's disease. Underwent gamma knife x2 and did well until September 2022 when pain returned.   At her last visit she was taking Lyrica 150 mg TID and was started on Lamictal. She was continued on Sinemet 25-100 mg 1 tab TID.  Interval History: She underwent gamma knife surgery in December 2022 which did help reduce her pain. She does still have occasional jabs and is taking the Lyrica 150 mg TID. Will go several days without any pain but then may have 3-4 jabs in one day if she is talking or smiling a lot. She is still being cautious with eating. Will only eat soft foods and only chew on the left side.  Lamictal did help with the pain, but she started to develop blisters on her arms so she stopped it. Rash resolved once she stopped Lamictal.   Physical Exam:   Vital Signs: BP 125/76    Pulse 71    Ht 5' (1.524 m)    Wt 106 lb (48.1 kg)    BMI 20.70 kg/m  GENERAL:  well appearing, in no acute distress, alert  SKIN:  Color, texture, turgor normal. No rashes or lesions HEAD:  Normocephalic/atraumatic. RESP: normal respiratory effort MSK:  No gross joint deformities.   NEUROLOGICAL: Mental Status: Alert, oriented to person, place and time, Follows commands, and Speech fluent and appropriate. Cranial Nerves: PERRL, face symmetric, no dysarthria, hearing grossly intact Motor: moves all extremities equally, +cogwheeling RUE, no tremor Gait: narrow based with mild shuffling  IMPRESSION: 81 year old female with a history of hypothyroidism who follows in clinic for trigeminal neuralgia and Parkinson's disease. She has had improvement in her pain since gamma knife surgery but continues to have intermittent breakthrough pain. Discussed trying baclofen for breakthrough pain, but she would prefer to  avoid adding more medications if possible. Will continue Lyrica 150 mg TID for now. Feels she is walking better with Sinemet and symptoms are well-controlled at her current dose.  PLAN: -Continue Lyrica 150 mg TID for trigeminal neuralgia -Continue Sinemet 25-100 mg 1 tab TID for Parkinson's -next steps: Consider SNRI, gabapentin, vimpat, botox for TN pain prevention. Consider baclofen PRN for breakthrough pain.  Follow-up: 1 year or sooner if needed  I spent a total of 23 minutes on the date of the service. Discussed medication side effects, adverse reactions and drug interactions. Written educational materials and patient instructions outlining all of the above were given.  Genia Harold, MD

## 2022-01-24 ENCOUNTER — Encounter: Payer: Self-pay | Admitting: Psychiatry

## 2022-01-25 ENCOUNTER — Other Ambulatory Visit: Payer: Self-pay | Admitting: Psychiatry

## 2022-01-25 MED ORDER — BACLOFEN 5 MG PO TABS: ORAL_TABLET | ORAL | 3 refills | Status: DC

## 2022-02-03 ENCOUNTER — Encounter: Payer: Self-pay | Admitting: Psychiatry

## 2022-02-03 NOTE — Telephone Encounter (Signed)
?  Patient is allergic to Lamotrigine, Trileptal, dilantin and other medications in the past. ? ?This will limit the medication choice. ? ?Please check with patient with Lyrica 150 mg follow-up for trigeminal pain, if it does, she can increase to 4 tabs a day. ? ?If she has  too much side effect from combination therapy, you may consider decrease baclofen to 5 mg 6 tablets a day ?

## 2022-02-21 ENCOUNTER — Encounter: Payer: Self-pay | Admitting: Psychiatry

## 2022-02-22 ENCOUNTER — Other Ambulatory Visit: Payer: Self-pay | Admitting: Psychiatry

## 2022-02-22 MED ORDER — PREGABALIN 150 MG PO CAPS: 150.0000 mg | ORAL_CAPSULE | Freq: Four times a day (QID) | ORAL | 3 refills | Status: DC

## 2022-02-22 NOTE — Telephone Encounter (Signed)
New rx sent to her pharmacy.  

## 2022-03-01 ENCOUNTER — Other Ambulatory Visit: Payer: Self-pay | Admitting: *Deleted

## 2022-03-01 ENCOUNTER — Encounter: Payer: Self-pay | Admitting: Psychiatry

## 2022-03-01 DIAGNOSIS — G20 Parkinson's disease: Secondary | ICD-10-CM

## 2022-03-01 MED ORDER — CARBIDOPA-LEVODOPA 25-100 MG PO TABS: 1.0000 | ORAL_TABLET | Freq: Three times a day (TID) | ORAL | 5 refills | Status: DC

## 2022-03-18 ENCOUNTER — Encounter: Payer: Self-pay | Admitting: Psychiatry

## 2022-03-18 NOTE — Telephone Encounter (Signed)
Please ask pt to taper the baclofen gradually to 1 pill tid prn or lower even and update [place] for regarding her Sx. If she has new onset SOB, she is advised to get checked out by PCP or go to ER.  ?

## 2022-03-20 ENCOUNTER — Other Ambulatory Visit: Payer: Self-pay | Admitting: Psychiatry

## 2022-03-29 IMAGING — MG MM DIGITAL SCREENING BILAT W/ TOMO AND CAD
8 series · 9 of 24 positions shown · non-contrast
Comparison: Previous exam(s).

CLINICAL DATA: Screening.

EXAM:
DIGITAL SCREENING BILATERAL MAMMOGRAM WITH TOMOSYNTHESIS AND CAD
TECHNIQUE: Bilateral screening digital craniocaudal and mediolateral oblique
mammograms were obtained. Bilateral screening digital breast
tomosynthesis was performed. The images were evaluated with
computer-aided detection.

[L MLO synth-2D]
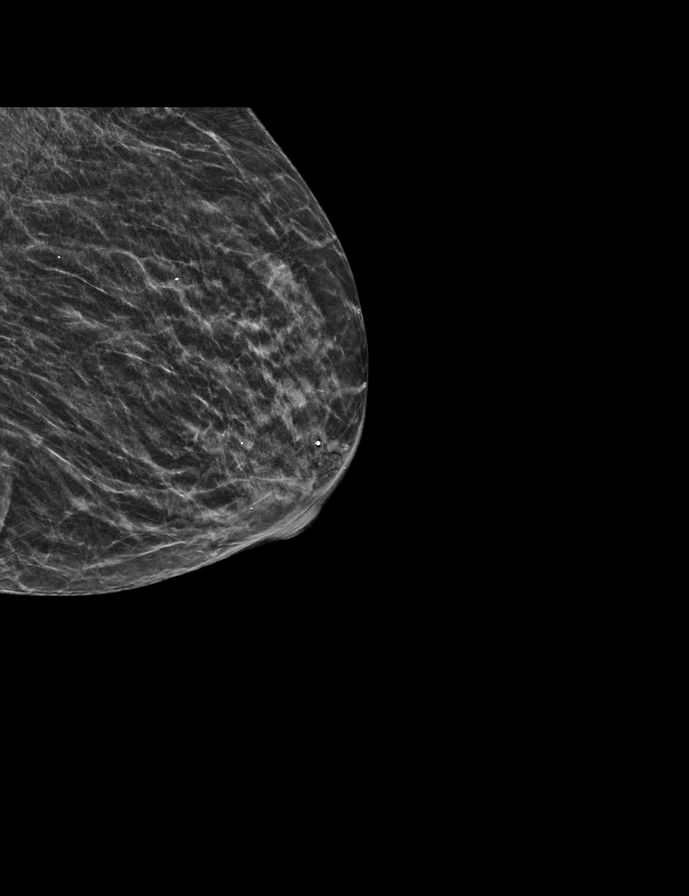

[L CC synth-2D]
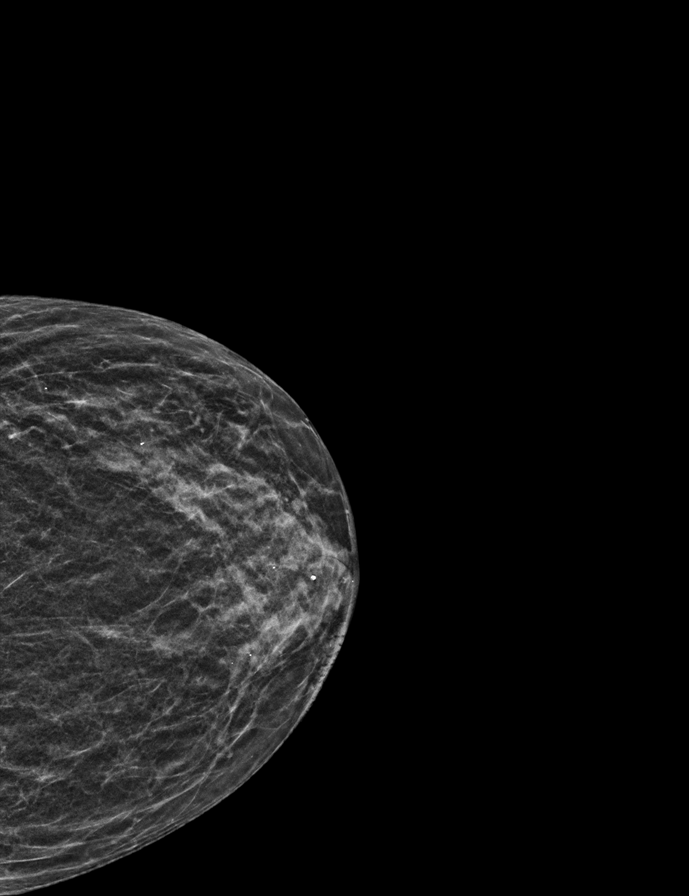

[R MLO synth-2D]
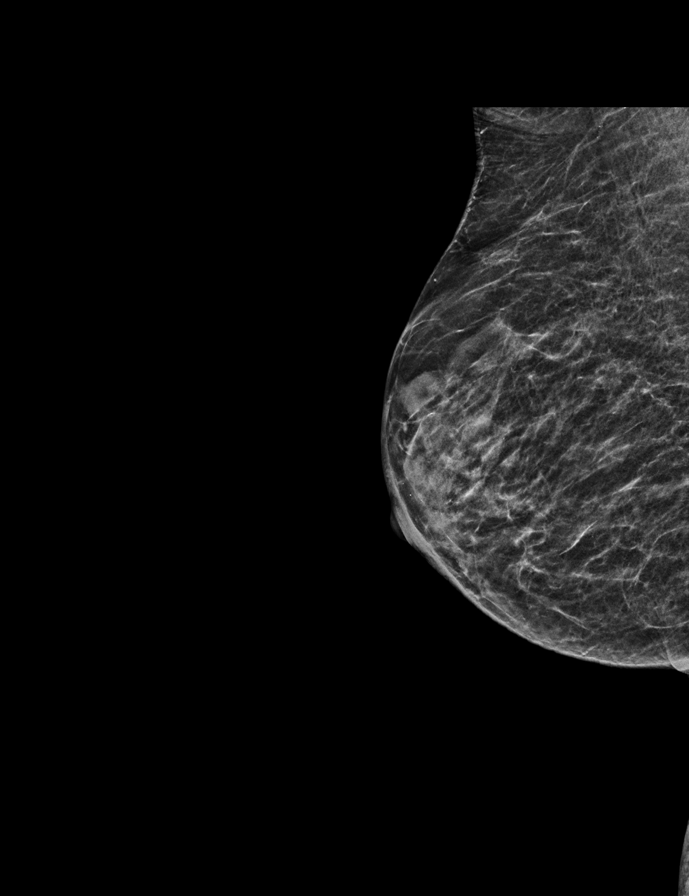

[R CC synth-2D]
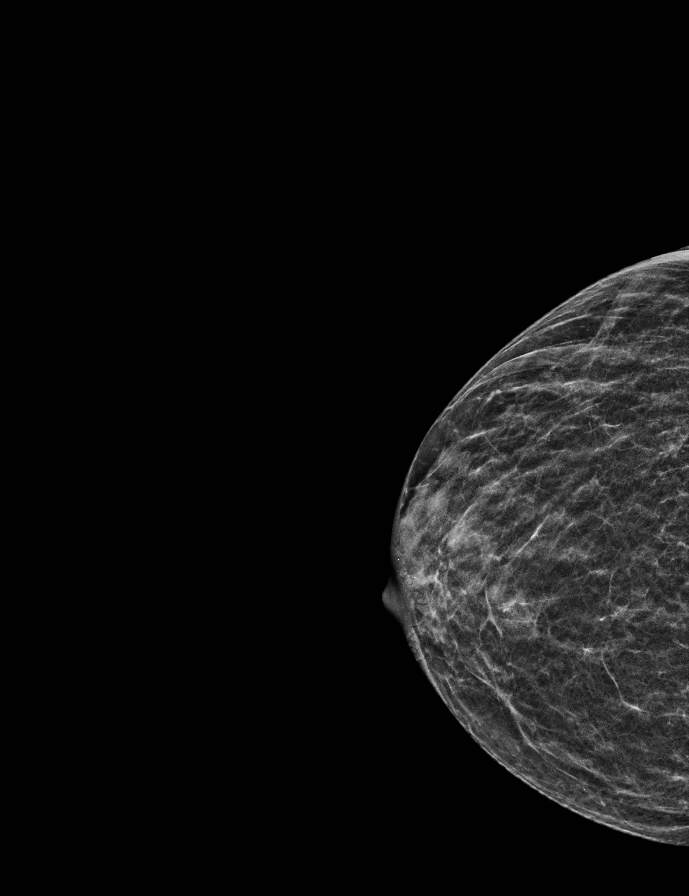

[R MLO tomo · 2 of 31 frames shown]
[frame 11/31]
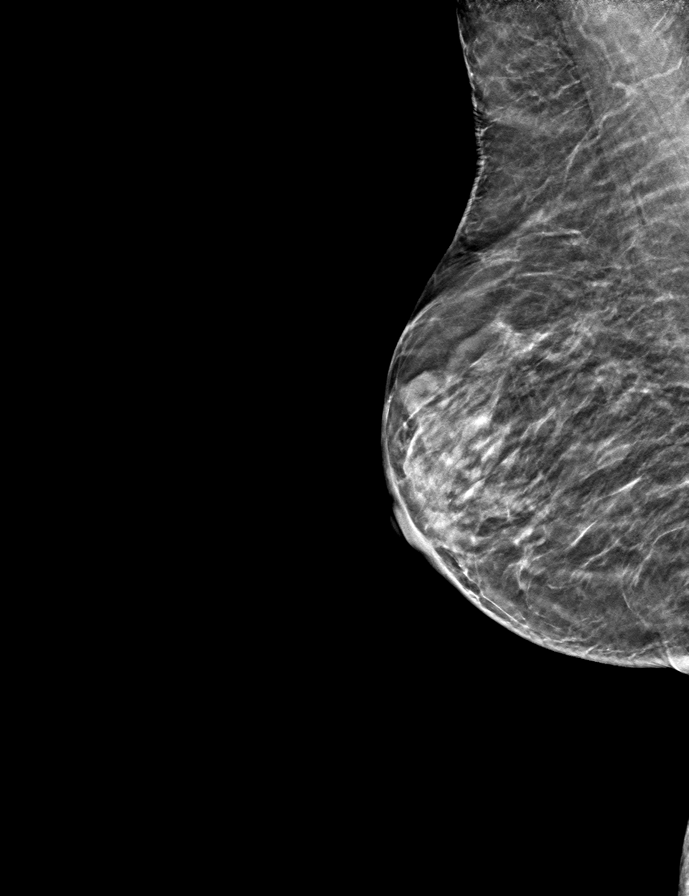
[frame 16/31]
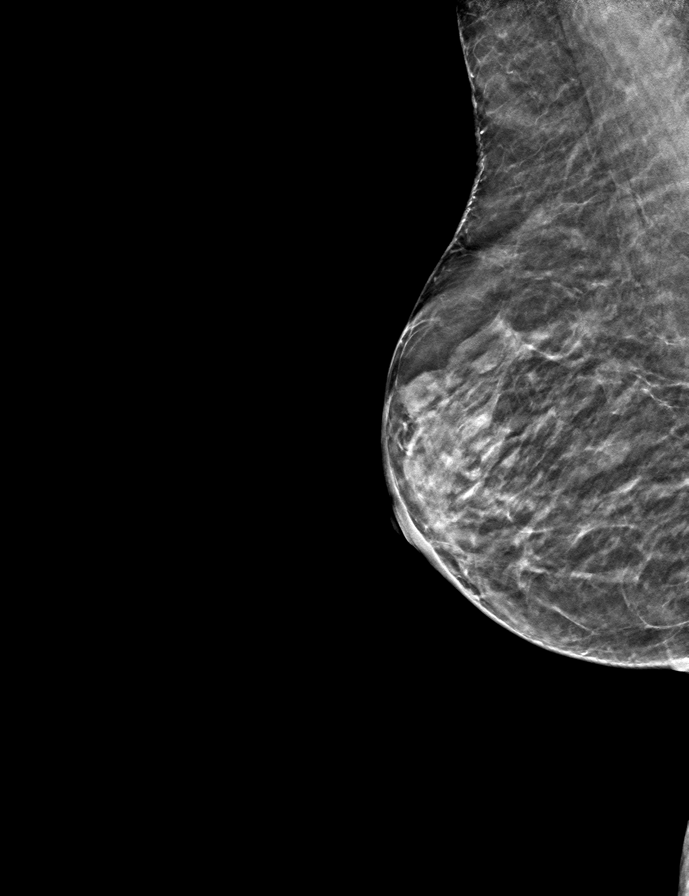

[R CC tomo · tomo slice 15/29.0]
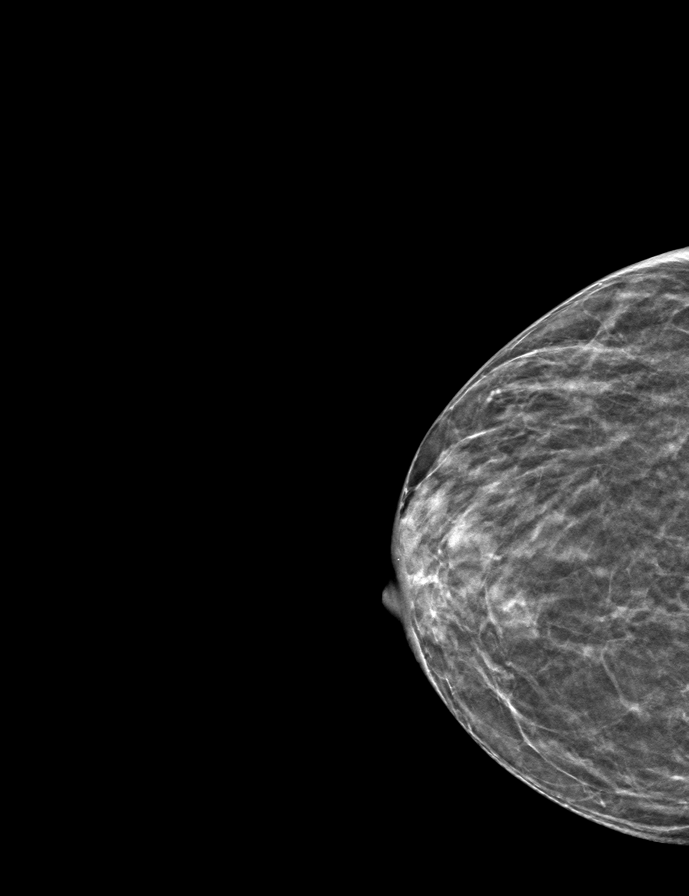

[L MLO tomo · tomo slice 17/33.0]
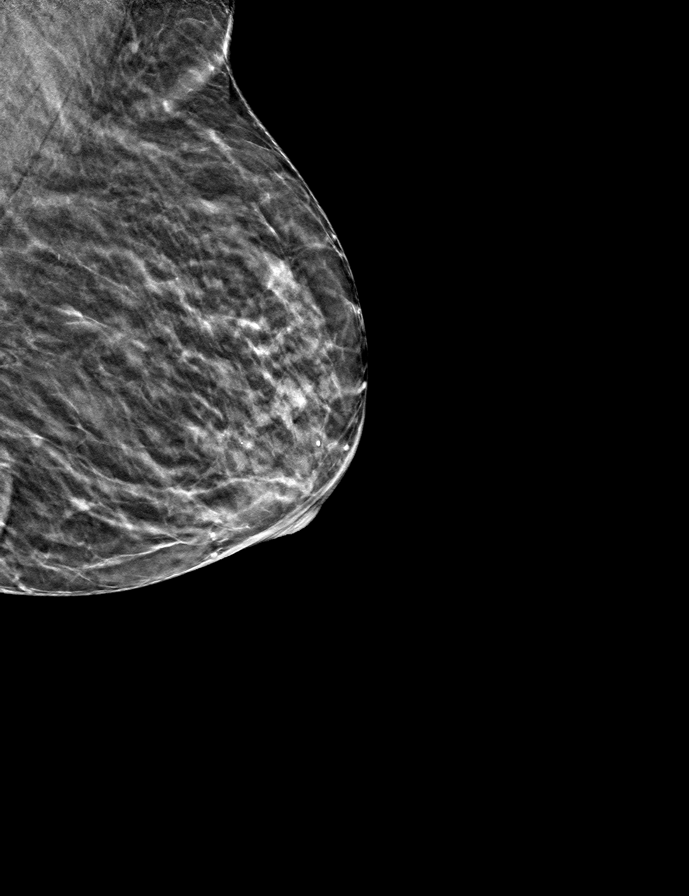

[L CC tomo · tomo slice 15/29.0]
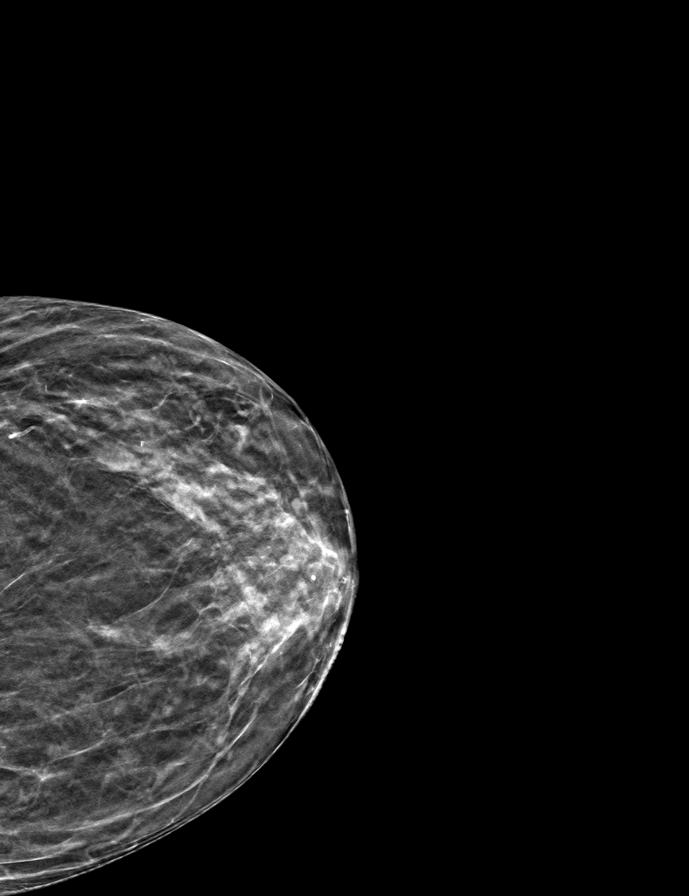

[9 of 24 positions shown; findings below may reference images not displayed]

ACR Breast Density Category c: The breast tissue is heterogeneously
dense, which may obscure small masses.
FINDINGS: There are no findings suspicious for malignancy.
IMPRESSION: No mammographic evidence of malignancy. A result letter of this
screening mammogram will be mailed directly to the patient.

RECOMMENDATION:
Screening mammogram in one year. (Code:Q3-W-BC3)

BI-RADS CATEGORY  1: Negative.

## 2022-04-07 ENCOUNTER — Encounter (INDEPENDENT_AMBULATORY_CARE_PROVIDER_SITE_OTHER): Payer: Medicare Other | Admitting: Psychiatry

## 2022-04-07 DIAGNOSIS — G500 Trigeminal neuralgia: Secondary | ICD-10-CM

## 2022-04-07 NOTE — Telephone Encounter (Signed)
Please see the MyChart message reply(ies) for my assessment and plan.  ?  ?This patient gave consent for this Medical Advice Message and is aware that it may result in a bill to [employer], as well as the possibility of receiving a bill for a co-payment or deductible. They are an established patient, but are not seeking medical advice exclusively about a problem treated during an in person or video visit in the last seven days. I did not recommend an in person or video visit within seven days of my reply.  ?  ?I spent a total of 10 minutes cumulative time within 7 days through [employer]. ? ?Genia Harold, MD  ?04/07/22 ?

## 2022-04-19 ENCOUNTER — Other Ambulatory Visit: Payer: Self-pay | Admitting: Psychiatry

## 2022-04-27 ENCOUNTER — Encounter: Payer: Self-pay | Admitting: Psychiatry

## 2022-05-01 ENCOUNTER — Other Ambulatory Visit: Payer: Self-pay | Admitting: Psychiatry

## 2022-05-03 ENCOUNTER — Other Ambulatory Visit: Payer: Self-pay | Admitting: Psychiatry

## 2022-05-06 ENCOUNTER — Encounter: Payer: Self-pay | Admitting: Psychiatry

## 2022-05-07 ENCOUNTER — Other Ambulatory Visit: Payer: Self-pay | Admitting: Psychiatry

## 2022-12-07 DIAGNOSIS — M79672 Pain in left foot: Secondary | ICD-10-CM | POA: Diagnosis not present

## 2022-12-07 DIAGNOSIS — G500 Trigeminal neuralgia: Secondary | ICD-10-CM | POA: Diagnosis not present

## 2022-12-07 DIAGNOSIS — Z23 Encounter for immunization: Secondary | ICD-10-CM | POA: Diagnosis not present

## 2022-12-07 DIAGNOSIS — Z79899 Other long term (current) drug therapy: Secondary | ICD-10-CM | POA: Diagnosis not present

## 2022-12-07 DIAGNOSIS — M5430 Sciatica, unspecified side: Secondary | ICD-10-CM | POA: Diagnosis not present

## 2022-12-07 DIAGNOSIS — Z136 Encounter for screening for cardiovascular disorders: Secondary | ICD-10-CM | POA: Diagnosis not present

## 2022-12-07 DIAGNOSIS — Z0000 Encounter for general adult medical examination without abnormal findings: Secondary | ICD-10-CM | POA: Diagnosis not present

## 2022-12-07 DIAGNOSIS — Z13220 Encounter for screening for lipoid disorders: Secondary | ICD-10-CM | POA: Diagnosis not present

## 2022-12-07 DIAGNOSIS — E039 Hypothyroidism, unspecified: Secondary | ICD-10-CM | POA: Diagnosis not present

## 2022-12-07 DIAGNOSIS — G20C Parkinsonism, unspecified: Secondary | ICD-10-CM | POA: Diagnosis not present

## 2022-12-07 DIAGNOSIS — E559 Vitamin D deficiency, unspecified: Secondary | ICD-10-CM | POA: Diagnosis not present

## 2022-12-07 DIAGNOSIS — M810 Age-related osteoporosis without current pathological fracture: Secondary | ICD-10-CM | POA: Diagnosis not present

## 2022-12-09 DIAGNOSIS — M5416 Radiculopathy, lumbar region: Secondary | ICD-10-CM | POA: Diagnosis not present

## 2022-12-16 DIAGNOSIS — M810 Age-related osteoporosis without current pathological fracture: Secondary | ICD-10-CM | POA: Diagnosis not present

## 2022-12-23 DIAGNOSIS — M5416 Radiculopathy, lumbar region: Secondary | ICD-10-CM | POA: Diagnosis not present

## 2023-01-03 ENCOUNTER — Encounter: Payer: Self-pay | Admitting: Podiatry

## 2023-01-03 ENCOUNTER — Ambulatory Visit: Payer: Medicare Other | Admitting: Podiatry

## 2023-01-03 DIAGNOSIS — M722 Plantar fascial fibromatosis: Secondary | ICD-10-CM

## 2023-01-05 NOTE — Progress Notes (Signed)
Subjective:   Patient ID: Yolanda Garrett, female   DOB: 82 y.o.   MRN: [id number]   HPI Patient is concerned about thickness of the left fifth nailbed and also is getting some callus in the ball of the foot.  States she is not having pain but she is concerned about the trauma   ROS      Objective:  Physical Exam  Neurovascular status intact muscle strength adequate with patient's left fifth nailbed found to be thickened and dystrophic and also moderate callus formation nonpainful     Assessment:  Nail disease that is probably more trauma versus fungus secondary to structure of the toe along with callus formation     Plan:  Reviewed both conditions do not recommend aggressive treatment except for trimming and at 1 point may require removal if it were to become sore.  Problem.  Educated her on this

## 2023-01-17 NOTE — Progress Notes (Unsigned)
   CC:  trigeminal neuralgia, Parkinson's disease  Follow-up Visit  Last visit: 01/18/22  Brief HPI: 82 year old female with a history of hypothyroidism who follows in clinic for right trigeminal neuralgia and Parkinson's disease. S/p gamma knife x3 (most recent 10/2021).  At her last visit she was continued on Lyrica 150 mg TID and Sinemet 25-100 mg TID.  Interval History: She continued to have pain, so she was prescribed PRN baclofen for breakthrough flares. Pain persisted and Lyrica was increased to 150 mg four times per day.   Currently***  She tapered off of Sinemet due to concerns for shortness of breath and a rash. Tremor, rigidity***   Physical Exam:   Vital Signs: There were no vitals taken for this visit. GENERAL:  well appearing, in no acute distress, alert  SKIN:  Color, texture, turgor normal. No rashes or lesions HEAD:  Normocephalic/atraumatic. RESP: normal respiratory effort MSK:  No gross joint deformities.   NEUROLOGICAL: Mental Status: Alert, oriented to person, place and time, Follows commands, and Speech fluent and appropriate. Cranial Nerves: PERRL, face symmetric, no dysarthria, hearing grossly intact Motor: moves all extremities equally Gait: normal-based.  IMPRESSION: ***  PLAN: ***   Follow-up: ***  I spent a total of *** minutes on the date of the service. Discussed medication side effects, adverse reactions and drug interactions. Written educational materials and patient instructions outlining all of the above were given.  Genia Harold, MD

## 2023-01-18 ENCOUNTER — Ambulatory Visit: Payer: Medicare Other | Admitting: Psychiatry

## 2023-01-18 ENCOUNTER — Encounter: Payer: Self-pay | Admitting: Psychiatry

## 2023-01-18 VITALS — BP 154/94 | HR 75 | Ht 60.0 in | Wt 110.0 lb

## 2023-01-18 DIAGNOSIS — G500 Trigeminal neuralgia: Secondary | ICD-10-CM

## 2023-01-18 DIAGNOSIS — G20A1 Parkinson's disease without dyskinesia, without mention of fluctuations: Secondary | ICD-10-CM | POA: Diagnosis not present

## 2023-01-18 NOTE — Patient Instructions (Signed)
Preventing Falls at Home  Falls are common, often dreaded events in the lives of older people. Aside from the obvious injuries and even death that may result, fall can cause wide-ranging consequences including loss of independence, mental decline, decreased activity and mobility. Younger people are also at risk of falling, especially those with chronic illnesses and fatigue.  Ways to reduce risk for falling   Examine diet and medications. Warm foods and alcohol dilate blood vessels, which can lead to dizziness when standing. Sleep aids, antidepressants and pain medications can also increase the likelihood of a fall.   Get a vision exam. Poor vision, cataracts and glaucoma increase the chances of falling.   Check foot gear. Shoes should fit snugly and have a sturdy, nonskid sole and a broad, low heel   Participate in a physician-approved exercise program to build and maintain muscle strength and improve balance and coordination. Programs that use ankle weights or stretch bands are excellent for muscle-strengthening. Water aerobics programs and low-impact Tai Chi programs have also been shown to improve balance and coordination.   Increase vitamin D intake. Vitamin D improves muscle strength and increases the amount of calcium the body is able to absorb and deposit in bones.  How to prevent falls from common hazards   Floors -- Remove all loose wires, cords, and throw rugs. Minimize clutter. Make sure rugs are anchored and smooth. Keep furniture in its usual place.   Chairs -- Use chairs with straight backs, armrests and firm seats. Add firm cushions to existing pieces to add height.   Bathroom -- Install grab bars and non-skid tape in the tub or shower. Use a bathtub transfer bench or a shower chair with a back support Use an elevated toilet seat and/or safety rails to assist standing from a low surface. Do not use towel racks or bathroom tissue holders to help you stand.   Lighting -- Make sure halls,  stairways, and entrances are well-lit. Install a night light in your bathroom or hallway. Make sure there is a light switch at the top and bottom of the staircase. Turn lights on if you get up in the middle of the night. Make sure lamps or light switches are within reach of the bed if you have to get up during the night.   Kitchen -- Install non-skid rubber mats near the sink and stove. Clean spills immediately. Store frequently used utensils, pots, pans between waist and eye level. This helps prevent reaching and bending. Sit when getting things out of lower cupboards.   Living room / Bedrooms - Place furniture with wide spaces in between, giving enough room to move around. Establish a route through the living room that gives you something to hold onto as you walk.   Stairs -- Make sure treads, rails, and rugs are secure. Install a rail on both sides of the stairs. If stairs are a threat, it might be helpful to arrange most of your activities on the lower level to reduce the number of times you must climb the stairs.   Entrances and doorways -- Install metal handles on the walls adjacent to the doorknobs of all doors to make it more secure as you travel through the doorway.   Tips for maintaining balance   Keep at least one hand free at all times. Try using a backpack or fanny pack to hold things rather than carrying them in your hands. Never carry objects in both hands when walking as this interferes with keeping   your balance.   Attempt to swing both arms from front to back while walking. This might require a conscious effort if Parkinson's disease has diminished your movement. It will, however, help you to maintain balance and posture, and reduce fatigue.   Consciously lift your feet off of the ground when walking. Shuffling and dragging of the feet is a common culprit in losing your balance.   When trying to navigate turns, use a "U" technique of facing forward and making a wide turn, rather than  pivoting sharply.   Try to stand with your feet shoulder-length apart. When your feet are close together for any length of time, you increase your risk of losing your balance and falling.   Do one thing at a time. Don't try to walk and accomplish another task, such as reading or looking around. The decrease in your automatic reflexes complicates motor function, so the less distraction, the better.   Do not wear rubber or gripping soled shoes, they might "catch" on the floor and cause tripping.   Move slowly when changing positions. Use deliberate, concentrated movements and, if needed, use a grab bar or walking aid. Count 15 seconds between each movement. For example, when rising from a seated position, wait 15 seconds after standing to begin walking.   If balance is a continuous problem, you might want to consider a walking aid such as a cane, walking stick, or walker. Once you've mastered walking with help, you might be ready to try it on your own again.    

## 2023-01-19 ENCOUNTER — Other Ambulatory Visit: Payer: Self-pay | Admitting: Physical Medicine and Rehabilitation

## 2023-01-19 DIAGNOSIS — M5416 Radiculopathy, lumbar region: Secondary | ICD-10-CM

## 2023-01-28 ENCOUNTER — Ambulatory Visit
Admission: RE | Admit: 2023-01-28 | Discharge: 2023-01-28 | Disposition: A | Discharge status: Home / Self Care | Payer: Medicare Other | Source: Ambulatory Visit | Attending: Physical Medicine and Rehabilitation | Admitting: Physical Medicine and Rehabilitation

## 2023-01-28 DIAGNOSIS — M5416 Radiculopathy, lumbar region: Secondary | ICD-10-CM

## 2023-01-28 DIAGNOSIS — M48061 Spinal stenosis, lumbar region without neurogenic claudication: Secondary | ICD-10-CM | POA: Diagnosis not present

## 2023-01-28 DIAGNOSIS — M5126 Other intervertebral disc displacement, lumbar region: Secondary | ICD-10-CM | POA: Diagnosis not present

## 2023-01-28 MED ORDER — IOPAMIDOL (ISOVUE-M 200) INJECTION 41%
18.0000 mL | Freq: Once | INTRAMUSCULAR | Status: AC
  Administered 2023-01-28: 18 mL via INTRATHECAL

## 2023-01-28 MED ORDER — ONDANSETRON HCL 4 MG/2ML IJ SOLN: 4.0000 mg | Freq: Once | INTRAMUSCULAR | Status: DC | PRN

## 2023-01-28 MED ORDER — MEPERIDINE HCL 50 MG/ML IJ SOLN: 50.0000 mg | Freq: Once | INTRAMUSCULAR | Status: DC | PRN

## 2023-01-28 MED ORDER — DIAZEPAM 5 MG PO TABS: 5.0000 mg | ORAL_TABLET | Freq: Once | ORAL | Status: DC

## 2023-01-28 NOTE — Progress Notes (Signed)
Pt was unable to stand for flexion and extension x-rays post CT contrast during myelogram procedure due to concern for patients safety and risk of falling. Dr. Jeralyn Ruths notified.

## 2023-01-28 NOTE — Discharge Instructions (Signed)

## 2023-02-04 DIAGNOSIS — R600 Localized edema: Secondary | ICD-10-CM | POA: Diagnosis not present

## 2023-02-04 DIAGNOSIS — G8929 Other chronic pain: Secondary | ICD-10-CM | POA: Diagnosis not present

## 2023-02-04 DIAGNOSIS — M549 Dorsalgia, unspecified: Secondary | ICD-10-CM | POA: Diagnosis not present

## 2023-02-08 DIAGNOSIS — M48062 Spinal stenosis, lumbar region with neurogenic claudication: Secondary | ICD-10-CM | POA: Diagnosis not present

## 2023-02-08 DIAGNOSIS — M6281 Muscle weakness (generalized): Secondary | ICD-10-CM | POA: Diagnosis not present

## 2023-02-09 ENCOUNTER — Encounter (INDEPENDENT_AMBULATORY_CARE_PROVIDER_SITE_OTHER): Payer: Medicare Other | Admitting: Psychiatry

## 2023-02-09 DIAGNOSIS — G510 Bell's palsy: Secondary | ICD-10-CM | POA: Diagnosis not present

## 2023-02-09 DIAGNOSIS — R29810 Facial weakness: Secondary | ICD-10-CM

## 2023-02-10 ENCOUNTER — Telehealth: Payer: Self-pay | Admitting: Psychiatry

## 2023-02-10 NOTE — Addendum Note (Signed)
Addended by: Genia Harold on: 02/10/2023 12:53 PM   Modules accepted: Orders

## 2023-02-10 NOTE — Telephone Encounter (Signed)
[employer] medicare NPR sent to GI [phone] 

## 2023-02-10 NOTE — Telephone Encounter (Signed)

## 2023-02-13 ENCOUNTER — Encounter: Payer: Self-pay | Admitting: Psychiatry

## 2023-02-14 ENCOUNTER — Emergency Department (HOSPITAL_COMMUNITY): Payer: Medicare Other

## 2023-02-14 ENCOUNTER — Other Ambulatory Visit: Payer: Self-pay | Admitting: *Deleted

## 2023-02-14 ENCOUNTER — Emergency Department (HOSPITAL_BASED_OUTPATIENT_CLINIC_OR_DEPARTMENT_OTHER): Payer: Medicare Other

## 2023-02-14 ENCOUNTER — Emergency Department (HOSPITAL_COMMUNITY)
Admission: EM | Admit: 2023-02-14 | Discharge: 2023-02-14 | Disposition: A | Discharge status: Home / Self Care | Payer: Medicare Other | Attending: Emergency Medicine | Admitting: Emergency Medicine

## 2023-02-14 ENCOUNTER — Encounter (HOSPITAL_COMMUNITY): Payer: Self-pay

## 2023-02-14 DIAGNOSIS — M549 Dorsalgia, unspecified: Secondary | ICD-10-CM | POA: Diagnosis not present

## 2023-02-14 DIAGNOSIS — R6889 Other general symptoms and signs: Secondary | ICD-10-CM | POA: Diagnosis not present

## 2023-02-14 DIAGNOSIS — R531 Weakness: Secondary | ICD-10-CM

## 2023-02-14 DIAGNOSIS — Z79890 Hormone replacement therapy (postmenopausal): Secondary | ICD-10-CM | POA: Diagnosis not present

## 2023-02-14 DIAGNOSIS — M48061 Spinal stenosis, lumbar region without neurogenic claudication: Secondary | ICD-10-CM | POA: Diagnosis not present

## 2023-02-14 DIAGNOSIS — E039 Hypothyroidism, unspecified: Secondary | ICD-10-CM | POA: Diagnosis not present

## 2023-02-14 DIAGNOSIS — R9431 Abnormal electrocardiogram [ECG] [EKG]: Secondary | ICD-10-CM | POA: Diagnosis not present

## 2023-02-14 DIAGNOSIS — R609 Edema, unspecified: Secondary | ICD-10-CM

## 2023-02-14 DIAGNOSIS — Z743 Need for continuous supervision: Secondary | ICD-10-CM | POA: Diagnosis not present

## 2023-02-14 DIAGNOSIS — M7989 Other specified soft tissue disorders: Secondary | ICD-10-CM

## 2023-02-14 DIAGNOSIS — R600 Localized edema: Secondary | ICD-10-CM | POA: Insufficient documentation

## 2023-02-14 DIAGNOSIS — R52 Pain, unspecified: Secondary | ICD-10-CM

## 2023-02-14 DIAGNOSIS — M48062 Spinal stenosis, lumbar region with neurogenic claudication: Secondary | ICD-10-CM

## 2023-02-14 DIAGNOSIS — R29818 Other symptoms and signs involving the nervous system: Secondary | ICD-10-CM | POA: Diagnosis not present

## 2023-02-14 LAB — COMPREHENSIVE METABOLIC PANEL
ALT: 27 U/L (ref 0–44)
AST: 33 U/L (ref 15–41)
Albumin: 4 g/dL (ref 3.5–5.0)
Alkaline Phosphatase: 57 U/L (ref 38–126)
Anion gap: 9 (ref 5–15)
BUN: 6 mg/dL — ABNORMAL LOW (ref 8–23)
CO2: 28 mmol/L (ref 22–32)
Calcium: 9.2 mg/dL (ref 8.9–10.3)
Chloride: 101 mmol/L (ref 98–111)
Creatinine, Ser: 0.59 mg/dL (ref 0.44–1.00)
GFR, Estimated: >60 mL/min (ref >60)
Glucose, Bld: 100 mg/dL — ABNORMAL HIGH (ref 70–99)
Potassium: 3.8 mmol/L (ref 3.5–5.1)
Sodium: 138 mmol/L (ref 135–145)
Total Bilirubin: 0.8 mg/dL (ref 0.3–1.2)
Total Protein: 7.1 g/dL (ref 6.5–8.1)

## 2023-02-14 LAB — DIFFERENTIAL
Abs Immature Granulocytes: 0.01 10*3/uL (ref 0.00–0.07)
Basophils Absolute: 0 10*3/uL (ref 0.0–0.1)
Basophils Relative: 1 %
Eosinophils Absolute: 0.1 10*3/uL (ref 0.0–0.5)
Eosinophils Relative: 2 %
Immature Granulocytes: 0 %
Lymphocytes Relative: 30 %
Lymphs Abs: 1.2 10*3/uL (ref 0.7–4.0)
Monocytes Absolute: 0.4 10*3/uL (ref 0.1–1.0)
Monocytes Relative: 9 %
Neutro Abs: 2.2 10*3/uL (ref 1.7–7.7)
Neutrophils Relative %: 58 %

## 2023-02-14 LAB — CBC
HCT: 39.3 % (ref 36.0–46.0)
Hemoglobin: 13.3 g/dL (ref 12.0–15.0)
MCH: 30.3 pg (ref 26.0–34.0)
MCHC: 33.8 g/dL (ref 30.0–36.0)
MCV: 89.5 fL (ref 80.0–100.0)
Platelets: 282 10*3/uL (ref 150–400)
RBC: 4.39 MIL/uL (ref 3.87–5.11)
RDW: 13.2 % (ref 11.5–15.5)
WBC: 3.8 10*3/uL — ABNORMAL LOW (ref 4.0–10.5)
nRBC: 0 % (ref 0.0–0.2)

## 2023-02-14 LAB — URINALYSIS, ROUTINE W REFLEX MICROSCOPIC
Bilirubin Urine: NEGATIVE
Glucose, UA: NEGATIVE mg/dL
Hgb urine dipstick: NEGATIVE
Ketones, ur: NEGATIVE mg/dL
Leukocytes,Ua: NEGATIVE
Nitrite: NEGATIVE
Protein, ur: NEGATIVE mg/dL
Specific Gravity, Urine: 1.002 — ABNORMAL LOW (ref 1.005–1.030)
pH: 8 (ref 5.0–8.0)

## 2023-02-14 LAB — TSH: TSH: 4.842 u[IU]/mL — ABNORMAL HIGH (ref 0.350–4.500)

## 2023-02-14 LAB — PROTIME-INR
INR: 1 (ref 0.8–1.2)
Prothrombin Time: 13.4 seconds (ref 11.4–15.2)

## 2023-02-14 MED ORDER — ONDANSETRON HCL 4 MG/2ML IJ SOLN
4.0000 mg | Freq: Once | INTRAMUSCULAR | Status: AC
  Administered 2023-02-14: 4 mg via INTRAVENOUS
  Filled 2023-02-14: qty 2

## 2023-02-14 MED ORDER — HYDROCODONE-ACETAMINOPHEN 5-325 MG PO TABS: 1.0000 | ORAL_TABLET | ORAL | 0 refills | Status: AC | PRN

## 2023-02-14 MED ORDER — HYDROCHLOROTHIAZIDE 25 MG PO TABS: 25.0000 mg | ORAL_TABLET | Freq: Every day | ORAL | 0 refills | Status: AC

## 2023-02-14 MED ORDER — SENNOSIDES-DOCUSATE SODIUM 8.6-50 MG PO TABS: 1.0000 | ORAL_TABLET | Freq: Two times a day (BID) | ORAL | 0 refills | Status: AC

## 2023-02-14 MED ORDER — MORPHINE SULFATE (PF) 4 MG/ML IV SOLN
4.0000 mg | Freq: Once | INTRAVENOUS | Status: AC
  Administered 2023-02-14: 4 mg via INTRAVENOUS
  Filled 2023-02-14: qty 1

## 2023-02-14 MED ORDER — PREGABALIN 150 MG PO CAPS: 150.0000 mg | ORAL_CAPSULE | Freq: Four times a day (QID) | ORAL | 1 refills | Status: AC

## 2023-02-14 MED ORDER — DIAZEPAM 5 MG/ML IJ SOLN
2.5000 mg | Freq: Once | INTRAMUSCULAR | Status: AC | PRN
  Administered 2023-02-14: 2.5 mg via INTRAVENOUS
  Filled 2023-02-14: qty 2

## 2023-02-14 MED ORDER — ONDANSETRON 4 MG PO TBDP: 4.0000 mg | ORAL_TABLET | Freq: Three times a day (TID) | ORAL | 0 refills | Status: AC | PRN

## 2023-02-14 MED ORDER — PREDNISONE 10 MG PO TABS: 20.0000 mg | ORAL_TABLET | Freq: Every day | ORAL | 0 refills | Status: AC

## 2023-02-14 NOTE — Care Management (Signed)
ED RNCM met with patient and spouse in rm 65 to discuss [facility] services.  Husband reports that [facility] services has been established for patient and is schedule to start 4/1 through Medicare Benefits.H states patient has w/c/ walker but can use a shower chair. Notified EDP for order will send order to DME company in the am. Patient given ED TOC contact information to folllow up within 10 days if the services with Medicare is not started.

## 2023-02-14 NOTE — ED Notes (Addendum)
Patient transported to MRI.  After Valium, administration Pt is arousable to voice.  Vitals remained stable.  O2 sat 100% RA.

## 2023-02-14 NOTE — ED Provider Notes (Signed)
[facility] Provider Note   CSN: [id number] Arrival date & time: 02/14/23  0932     History  Chief Complaint  Patient presents with   Weakness   Leg Swelling    Yolanda Garrett is a 82 y.o. female.  Pt is a 82 yo female with pmhx significant for trigeminal neuralgia, hypothyroidism, osteoporosis, scoliosis, severe spinal stenosis L4-L5, lumbar radiculopathy.  She's been seeing Dr. Nelva Bush at [facility] for her radiculopathy.  Pt has had severe pain in her left leg after a L4 selective nerve root block.  She had a CT myelogram on 3/1.  Since then, she's not been able to ambulate on her own.  She's been having to use a walker.  Pain has been much worse.  She's not been able to sleep.  She's had some drooling.  Patient has seen Dr. Rolena Infante who declined surgery b/c of age.  Plan was to refer to Dr. Billey Gosling (neurology), but pt can't get an appt until October.  Brain MRI has been ordered, but not yet done.  Due to the severe spinal stenosis, Dr. Nelva Bush has referred her to Dr. Sherley Bounds (NS) for possible decompression.  She was given a rx for bacloven.  Pt said her pain has been horrible.  She also has noticed increased swelling in her legs.  She said she was told to drink a lot of water, so she's been doing that.           Home Medications Prior to Admission medications   Medication Sig Start Date End Date Taking? Authorizing Provider  HYDROcodone-acetaminophen (NORCO/VICODIN) 5-325 MG tablet Take 1 tablet by mouth every 4 (four) hours as needed. 02/14/23  Yes Isla Pence, MD  ondansetron (ZOFRAN-ODT) 4 MG disintegrating tablet Take 1 tablet (4 mg total) by mouth every 8 (eight) hours as needed for nausea or vomiting. 02/14/23  Yes Isla Pence, MD  senna-docusate (SENOKOT-S) 8.6-50 MG tablet Take 1 tablet by mouth 2 (two) times daily. 02/14/23  Yes Isla Pence, MD  Baclofen 5 MG TABS take 1 to 2 tablets by mouth daily as needed for facial  pain. Patient not taking: Reported on 01/18/2023 05/11/22   Genia Harold, MD  Calcium Carbonate-Vitamin D 600-400 MG-UNIT tablet Take 2 tablets by mouth daily.     [provider]  Cholecalciferol (VITAMIN D3) 2000 UNITS TABS Take 1 tablet by mouth daily.    [provider]  denosumab (PROLIA) 60 MG/ML SOLN injection Inject 60 mg into the skin every 6 (six) months. Administer in upper arm, thigh, or abdomen    [provider]  levothyroxine (SYNTHROID, LEVOTHROID) 100 MCG tablet Take 100 mcg by mouth daily before breakfast.    [provider]  pregabalin (LYRICA) 150 MG capsule Take 1 capsule (150 mg total) by mouth in the morning, at noon, in the evening, and at bedtime. 02/14/23   Genia Harold, MD      Allergies    Carbidopa-levodopa, Amoxicillin, Codeine, Trileptal [oxcarbazepine], Ultram [tramadol], Clindamycin/lincomycin, Dilantin [phenytoin sodium extended], Fosamax [alendronate sodium], Hydromorphone, Sulfa antibiotics, and Lamictal [lamotrigine]    Review of Systems   Review of Systems  Cardiovascular:  Positive for leg swelling.  Musculoskeletal:  Positive for back pain.  All other systems reviewed and are negative.   Physical Exam Updated Vital Signs BP 112/60   Pulse 78   Temp (!) 97.5 F (36.4 C) (Oral)   Resp 12   Ht 5' (1.524 m)  Wt 48.1 kg   SpO2 97%   BMI 20.70 kg/m  Physical Exam Vitals and nursing note reviewed.  Constitutional:      Appearance: Normal appearance.  HENT:     Head: Normocephalic and atraumatic.     Right Ear: External ear normal.     Left Ear: External ear normal.     Nose: Nose normal.     Mouth/Throat:     Mouth: Mucous membranes are dry.  Eyes:     Extraocular Movements: Extraocular movements intact.     Conjunctiva/sclera: Conjunctivae normal.     Pupils: Pupils are equal, round, and reactive to light.  Cardiovascular:     Rate and Rhythm: Normal rate and regular rhythm.     Pulses: Normal  pulses.     Heart sounds: Normal heart sounds.  Pulmonary:     Effort: Pulmonary effort is normal.     Breath sounds: Normal breath sounds.  Abdominal:     General: Abdomen is flat. Bowel sounds are normal.     Palpations: Abdomen is soft.  Musculoskeletal:     Cervical back: Normal range of motion and neck supple.     Right lower leg: Edema present.     Left lower leg: Edema present.  Skin:    General: Skin is warm.     Capillary Refill: Capillary refill takes less than 2 seconds.  Neurological:     General: No focal deficit present.     Mental Status: She is alert and oriented to person, place, and time.  Psychiatric:        Mood and Affect: Mood normal.        Behavior: Behavior normal.     ED Results / Procedures / Treatments   Labs (all labs ordered are listed, but only abnormal results are displayed) Labs Reviewed  CBC - Abnormal; Notable for the following components:      Result Value   WBC 3.8 (*)    All other components within normal limits  COMPREHENSIVE METABOLIC PANEL - Abnormal; Notable for the following components:   Glucose, Bld 100 (*)    BUN 6 (*)    All other components within normal limits  URINALYSIS, ROUTINE W REFLEX MICROSCOPIC - Abnormal; Notable for the following components:   Color, Urine COLORLESS (*)    Specific Gravity, Urine 1.002 (*)    All other components within normal limits  TSH - Abnormal; Notable for the following components:   TSH 4.842 (*)    All other components within normal limits  PROTIME-INR  DIFFERENTIAL    EKG EKG Interpretation  Date/Time:  Monday February 14 2023 11:24:18 EDT Ventricular Rate:  85 PR Interval:  182 QRS Duration: 54 QT Interval:  362 QTC Calculation: 430 R Axis:   97 Text Interpretation: Normal sinus rhythm Possible Left atrial enlargement Rightward axis Borderline ECG When compared with ECG of 22-Aug-2016 21:54, PREVIOUS ECG IS PRESENT Poor data quality in current ECG precludes serial comparison  Confirmed by Isla Pence [phone]) on 02/14/2023 12:48:14 PM  Radiology MR BRAIN WO CONTRAST  Result Date: 02/14/2023 CLINICAL DATA:  Neuro deficit, acute, stroke suspected. EXAM: MRI HEAD WITHOUT CONTRAST TECHNIQUE: Multiplanar, multiecho pulse sequences of the brain and surrounding structures were obtained without intravenous contrast. COMPARISON:  MRI brain 08/13/2016. FINDINGS: Brain: No acute infarct or intracranial hemorrhage. Unchanged mild chronic small-vessel disease. Single focus of chronic microhemorrhage in the right periatrial white matter. No hydrocephalus or extra-axial collection. No mass or midline shift. Vascular:  Normal flow voids. Skull and upper cervical spine: Normal marrow signal. Unchanged 3 mm degenerative anterolisthesis of C2 on C3. Sinuses/Orbits: Mild mucosal disease in the right anterior ethmoid air cells and inferior frontal sinus. Orbits are unremarkable. Other: None. IMPRESSION: No acute intracranial abnormality or mass. Electronically Signed   By: Emmit Alexanders M.D.   On: 02/14/2023 14:55   VAS [place] LOWER EXTREMITY VENOUS (DVT) (7a-7p)  Result Date: 02/14/2023  Lower Venous DVT Study Patient Name:  Yolanda Garrett  Date of Exam:   02/14/2023 Medical Rec #: [id number]       Accession #:    [id number] Date of Birth: June 15, 1941        Patient Gender: F Patient Age:   15 years Exam Location:  [facility] Procedure:      VAS [place] LOWER EXTREMITY VENOUS (DVT) Referring Phys: Nancey Kreitz --------------------------------------------------------------------------------  Indications: Pain, Swelling, and weakness.  Comparison Study: No prior study. Performing Technologist: McKayla Maag RVT, VT  Examination Guidelines: A complete evaluation includes B-mode imaging, spectral Doppler, color Doppler, and power Doppler as needed of all accessible portions of each vessel. Bilateral testing is considered an integral part of a complete examination. Limited examinations for reoccurring  indications may be performed as noted. The reflux portion of the exam is performed with the patient in reverse Trendelenburg.  +---------+---------------+---------+-----------+----------+-------------------+ RIGHT    CompressibilityPhasicitySpontaneityPropertiesThrombus Aging      +---------+---------------+---------+-----------+----------+-------------------+ CFV      Full           Yes      Yes                                      +---------+---------------+---------+-----------+----------+-------------------+ SFJ      Full                                                             +---------+---------------+---------+-----------+----------+-------------------+ FV Prox  Full                                                             +---------+---------------+---------+-----------+----------+-------------------+ FV Mid   Full                                                             +---------+---------------+---------+-----------+----------+-------------------+ FV DistalFull                                                             +---------+---------------+---------+-----------+----------+-------------------+ PFV      Full                                                             +---------+---------------+---------+-----------+----------+-------------------+  POP      Full           Yes      Yes                                      +---------+---------------+---------+-----------+----------+-------------------+ PTV      Full                                                             +---------+---------------+---------+-----------+----------+-------------------+ PERO     Full                                         Not well visualized +---------+---------------+---------+-----------+----------+-------------------+   +----+---------------+---------+-----------+----------+--------------+  LEFTCompressibilityPhasicitySpontaneityPropertiesThrombus Aging +----+---------------+---------+-----------+----------+--------------+ CFV Full           Yes      Yes                                 +----+---------------+---------+-----------+----------+--------------+ SFJ Full                                                        +----+---------------+---------+-----------+----------+--------------+    Summary: RIGHT: - There is no evidence of deep vein thrombosis in the lower extremity. However, portions of this examination were limited- see technologist comments above.  - No cystic structure found in the popliteal fossa.  LEFT: - No evidence of common femoral vein obstruction.  *See table(s) above for measurements and observations. Electronically signed by Monica Martinez MD on 02/14/2023 at 2:16:15 PM.    Final    DG Chest Portable 1 View  Result Date: 02/14/2023 CLINICAL DATA:  Table formatting from the original note was not included. Reason for exam: AMS Per ED triage notes: "Per EMS, Pt, from home, c/o increasing bilateral leg swelling and weakness x2 weeks. EXAM: PORTABLE CHEST - 1 VIEW COMPARISON:  None Available. FINDINGS: Lungs are clear. Heart size upper limits normal. Aortic Atherosclerosis (ICD10-170.0). No effusion. Visualized bones unremarkable. IMPRESSION: No acute cardiopulmonary disease. Electronically Signed   By: Lucrezia Europe M.D.   On: 02/14/2023 10:37    Procedures Procedures    Medications Ordered in ED Medications  morphine (PF) 4 MG/ML injection 4 mg (4 mg Intravenous Given 02/14/23 1052)  ondansetron (ZOFRAN) injection 4 mg (4 mg Intravenous Given 02/14/23 1052)  diazepam (VALIUM) injection 2.5 mg (2.5 mg Intravenous Given 02/14/23 1402)    ED Course/ Medical Decision Making/ A&P                             Medical Decision Making Amount and/or Complexity of Data Reviewed Labs: ordered. Radiology: ordered.  Risk Prescription drug  management.   This patient presents to the ED for concern of back pain, weakness, this involves an extensive number of treatment options, and is a complaint that carries with it a high  risk of complications and morbidity.  The differential diagnosis includes cva, spinal stenosis   Co morbidities that complicate the patient evaluation  trigeminal neuralgia, hypothyroidism, osteoporosis, scoliosis, severe spinal stenosis L4-L5, lumbar radiculopathy   Additional history obtained:  Additional history obtained from epic chart review External records from outside source obtained and reviewed including EMS report   Lab Tests:  I Ordered, and personally interpreted labs.  The pertinent results include:  cbc nl, tsh nl, cmp nl, ua nl   Imaging Studies ordered:  I ordered imaging studies including cxr, [place], mri brain  I independently visualized and interpreted imaging which showed  CXR: No acute cardiopulmonary disease.  [place]: - There is no evidence of deep vein thrombosis in the lower extremity. However, portions of this examination were limited- see technologist comments above.     - No cystic structure found in the popliteal fossa.     LEFT:  - No evidence of common femoral vein obstruction.  MRI brain: No acute intracranial abnormality or mass.  I agree with the radiologist interpretation   Cardiac Monitoring:  The patient was maintained on a cardiac monitor.  I personally viewed and interpreted the cardiac monitored which showed an underlying rhythm of: nsr   Medicines ordered and prescription drug management:  I ordered medication including valium, morphine  for pain  Reevaluation of the patient after these medicines showed that the patient improved I have reviewed the patients home medicines and have made adjustments as needed   Test Considered:  mri   Critical Interventions:  Pain control   Problem List / ED Course:  Back pain:  Pt has severe lumbar  stenosis.  She likely has worse pain b/c CT myelogram has irritated the nerve roots.  Pain is better now.  Pt does not want admission for pain control.  Pt is d/c with steroids and lortab.  I have consulted TOC for home PT.  She is also encouraged to make an appt with Dr. Ronnald Ramp as recommended by Dr. Nelva Bush.  Pt is not interested in a big surgery, but there may be other interventions that may be helpful. BLE swelling:  no DVT.  Pt encouraged to use compression hose.  Pt is put on hctz for swelling.   Reevaluation:  After the interventions noted above, I reevaluated the patient and found that they have :improved   Social Determinants of Health:  Lives at home   Dispostion:  After consideration of the diagnostic results and the patients response to treatment, I feel that the patent would benefit from discharge with outpatient f/u.          Final Clinical Impression(s) / ED Diagnoses Final diagnoses:  Spinal stenosis of lumbar region with neurogenic claudication    Rx / DC Orders ED Discharge Orders          Ordered    HYDROcodone-acetaminophen (NORCO/VICODIN) 5-325 MG tablet  Every 4 hours PRN        02/14/23 1613    ondansetron (ZOFRAN-ODT) 4 MG disintegrating tablet  Every 8 hours PRN        02/14/23 1613    senna-docusate (SENOKOT-S) 8.6-50 MG tablet  2 times daily        02/14/23 1613              Isla Pence, MD 02/14/23 1625

## 2023-02-14 NOTE — Telephone Encounter (Signed)
Pt sent mychart asking for refill on Lyrica. Per drug registry, last refilled 11/17/22 #270. Last seen 01/18/23 and next f/u 09/07/23.

## 2023-02-14 NOTE — ED Notes (Signed)
Case Management at bedside.

## 2023-02-14 NOTE — ED Triage Notes (Signed)
Per EMS, Pt, from home, c/o increasing bilateral leg swelling and weakness x2 weeks.  Leg swelling began after Pt had a CT w/ contrast x2 weeks ago.  EMS reports CT d/t chronic back pain.    Pt reports she recently had "shots for sciatica which worked on the right, but not on the left."

## 2023-02-14 NOTE — ED Notes (Signed)
US at bedside

## 2023-02-14 NOTE — ED Notes (Signed)
Pt verbalized understanding of discharge instructions. Opportunity for questions provided.   Pt wheeled to lobby.

## 2023-02-14 NOTE — Progress Notes (Signed)
Right lower extremity venous study completed.   Preliminary results relayed to RN.  Please see CV Procedures for preliminary results.  Mattis Featherly, RVT  12:16 PM 02/14/23

## 2023-02-14 NOTE — Discharge Instructions (Addendum)
Compression hose for legs

## 2023-02-14 NOTE — ED Notes (Signed)
ED Provider at bedside. 

## 2023-02-14 NOTE — Care Management (Signed)
    Durable Medical Equipment  (From admission, onward)           Start     Ordered   02/14/23 0000  For home use only DME Shower stool        02/14/23 1752

## 2023-02-17 DIAGNOSIS — M48062 Spinal stenosis, lumbar region with neurogenic claudication: Secondary | ICD-10-CM | POA: Diagnosis not present

## 2023-02-17 DIAGNOSIS — E039 Hypothyroidism, unspecified: Secondary | ICD-10-CM | POA: Diagnosis not present

## 2023-02-18 ENCOUNTER — Telehealth: Payer: Self-pay | Admitting: Psychiatry

## 2023-02-18 NOTE — Telephone Encounter (Signed)
Pt husband called. Stated he would like to speak with nurse about MRI. Stated he wants to know if this is really needed.

## 2023-02-21 NOTE — Telephone Encounter (Signed)
I reviewed her MRI done on the 18th, which did not show any cause for her new symptoms. Since this MRI looked okay, she does not need to get the MRI that I had ordered on the 13th. Thanks

## 2023-03-04 DIAGNOSIS — R0781 Pleurodynia: Secondary | ICD-10-CM | POA: Diagnosis not present

## 2023-03-04 DIAGNOSIS — R1031 Right lower quadrant pain: Secondary | ICD-10-CM | POA: Diagnosis not present

## 2023-03-10 DIAGNOSIS — M7989 Other specified soft tissue disorders: Secondary | ICD-10-CM | POA: Diagnosis not present

## 2023-03-10 DIAGNOSIS — G959 Disease of spinal cord, unspecified: Secondary | ICD-10-CM | POA: Diagnosis not present

## 2023-03-10 DIAGNOSIS — Z9989 Dependence on other enabling machines and devices: Secondary | ICD-10-CM | POA: Diagnosis not present

## 2023-03-10 DIAGNOSIS — G20B1 Parkinson's disease with dyskinesia, without mention of fluctuations: Secondary | ICD-10-CM | POA: Diagnosis not present

## 2023-03-11 ENCOUNTER — Other Ambulatory Visit (HOSPITAL_COMMUNITY): Payer: Self-pay | Admitting: Student

## 2023-03-11 DIAGNOSIS — G959 Disease of spinal cord, unspecified: Secondary | ICD-10-CM

## 2023-03-15 DIAGNOSIS — Z9989 Dependence on other enabling machines and devices: Secondary | ICD-10-CM | POA: Diagnosis not present

## 2023-03-15 DIAGNOSIS — M7989 Other specified soft tissue disorders: Secondary | ICD-10-CM | POA: Diagnosis not present

## 2023-03-15 DIAGNOSIS — M25471 Effusion, right ankle: Secondary | ICD-10-CM | POA: Diagnosis not present

## 2023-03-15 DIAGNOSIS — R238 Other skin changes: Secondary | ICD-10-CM | POA: Diagnosis not present

## 2023-03-18 DIAGNOSIS — M7989 Other specified soft tissue disorders: Secondary | ICD-10-CM | POA: Diagnosis not present

## 2023-03-18 DIAGNOSIS — Z9989 Dependence on other enabling machines and devices: Secondary | ICD-10-CM | POA: Diagnosis not present

## 2023-03-24 ENCOUNTER — Telehealth: Payer: Self-pay

## 2023-03-24 DIAGNOSIS — G20A1 Parkinson's disease without dyskinesia, without mention of fluctuations: Secondary | ICD-10-CM

## 2023-03-24 NOTE — Patient Outreach (Signed)
Received a referral for Mrs. Theroux from [employer] PA from [place] at [facility].   I have sent this referral to the [facility] Care Guides to assign a Nurse Care Coordinator.  Iverson Alamin, Don[facility] Care Management Assistant [facility] Care Management [phone]

## 2023-03-25 ENCOUNTER — Ambulatory Visit (HOSPITAL_COMMUNITY)
Admission: RE | Admit: 2023-03-25 | Discharge: 2023-03-25 | Disposition: A | Discharge status: Home / Self Care | Payer: Medicare Other | Source: Ambulatory Visit | Attending: Student | Admitting: Student

## 2023-03-25 DIAGNOSIS — M4312 Spondylolisthesis, cervical region: Secondary | ICD-10-CM | POA: Diagnosis not present

## 2023-03-25 DIAGNOSIS — M7989 Other specified soft tissue disorders: Secondary | ICD-10-CM | POA: Diagnosis not present

## 2023-03-25 DIAGNOSIS — G959 Disease of spinal cord, unspecified: Secondary | ICD-10-CM

## 2023-03-28 ENCOUNTER — Other Ambulatory Visit: Payer: Self-pay | Admitting: *Deleted

## 2023-03-28 DIAGNOSIS — G2581 Restless legs syndrome: Secondary | ICD-10-CM

## 2023-03-28 DIAGNOSIS — M79604 Pain in right leg: Secondary | ICD-10-CM

## 2023-03-29 DIAGNOSIS — G959 Disease of spinal cord, unspecified: Secondary | ICD-10-CM | POA: Diagnosis not present

## 2023-03-30 ENCOUNTER — Telehealth: Payer: Self-pay | Admitting: *Deleted

## 2023-03-30 DIAGNOSIS — Z556 Problems related to health literacy: Secondary | ICD-10-CM | POA: Diagnosis not present

## 2023-03-30 DIAGNOSIS — G500 Trigeminal neuralgia: Secondary | ICD-10-CM | POA: Diagnosis not present

## 2023-03-30 DIAGNOSIS — G20B1 Parkinson's disease with dyskinesia, without mention of fluctuations: Secondary | ICD-10-CM | POA: Diagnosis not present

## 2023-03-30 DIAGNOSIS — M48062 Spinal stenosis, lumbar region with neurogenic claudication: Secondary | ICD-10-CM | POA: Diagnosis not present

## 2023-03-30 DIAGNOSIS — E039 Hypothyroidism, unspecified: Secondary | ICD-10-CM | POA: Diagnosis not present

## 2023-03-30 DIAGNOSIS — E559 Vitamin D deficiency, unspecified: Secondary | ICD-10-CM | POA: Diagnosis not present

## 2023-03-30 DIAGNOSIS — M5430 Sciatica, unspecified side: Secondary | ICD-10-CM | POA: Diagnosis not present

## 2023-03-30 DIAGNOSIS — M810 Age-related osteoporosis without current pathological fracture: Secondary | ICD-10-CM | POA: Diagnosis not present

## 2023-03-30 NOTE — Progress Notes (Signed)
  Care Coordination   Note   03/30/2023 Name: Yolanda Garrett [facility] MRN: [id number] DOB: 01-08-1941  Yolanda Garrett is a 82 y.o. year old female who sees Moshe Cipro, NP for primary care. I reached out to [facility] by phone today to offer care coordination services.  [name] was given information about Care Coordination services today including:   The Care Coordination services include support from the care team which includes your Nurse Coordinator, Clinical Social Worker, or Pharmacist.  The Care Coordination team is here to help remove barriers to the health concerns and goals most important to you. Care Coordination services are voluntary, and the patient may decline or stop services at any time by request to their care team member.   Care Coordination Consent Status: Patient agreed to services and verbal consent obtained.  Patient gave verbal permission to speak to [facility] son and POA  Follow up plan:  Telephone appointment with care coordination team member scheduled for:  04/01/23  Encounter Outcome:  Pt. Scheduled  [facility] Coordination Care Guide  Direct Dial: [phone]

## 2023-03-30 NOTE — Progress Notes (Signed)
  Care Coordination  Outreach Note  03/30/2023 Name: Yolanda Garrett [facility] MRN: [id number] DOB: Apr 06, 1941   Care Coordination Outreach Attempts: An unsuccessful telephone outreach was attempted today to offer the patient information about available care coordination services.  Follow Up Plan:  Additional outreach attempts will be made to offer the patient care coordination information and services.   Encounter Outcome:  No Answer  Christie Nottingham  Care Coordination Care Guide  Direct Dial: [phone]

## 2023-03-31 ENCOUNTER — Ambulatory Visit (HOSPITAL_COMMUNITY)
Admission: RE | Admit: 2023-03-31 | Discharge: 2023-03-31 | Disposition: A | Discharge status: Home / Self Care | Payer: Medicare Other | Source: Ambulatory Visit | Attending: Vascular Surgery | Admitting: Vascular Surgery

## 2023-03-31 DIAGNOSIS — M79604 Pain in right leg: Secondary | ICD-10-CM | POA: Diagnosis not present

## 2023-03-31 DIAGNOSIS — E559 Vitamin D deficiency, unspecified: Secondary | ICD-10-CM | POA: Diagnosis not present

## 2023-03-31 DIAGNOSIS — M79605 Pain in left leg: Secondary | ICD-10-CM | POA: Insufficient documentation

## 2023-03-31 DIAGNOSIS — G500 Trigeminal neuralgia: Secondary | ICD-10-CM | POA: Diagnosis not present

## 2023-03-31 DIAGNOSIS — G20B1 Parkinson's disease with dyskinesia, without mention of fluctuations: Secondary | ICD-10-CM | POA: Diagnosis not present

## 2023-03-31 DIAGNOSIS — E039 Hypothyroidism, unspecified: Secondary | ICD-10-CM | POA: Diagnosis not present

## 2023-03-31 DIAGNOSIS — M48062 Spinal stenosis, lumbar region with neurogenic claudication: Secondary | ICD-10-CM | POA: Diagnosis not present

## 2023-03-31 DIAGNOSIS — M5430 Sciatica, unspecified side: Secondary | ICD-10-CM | POA: Diagnosis not present

## 2023-03-31 DIAGNOSIS — M810 Age-related osteoporosis without current pathological fracture: Secondary | ICD-10-CM | POA: Diagnosis not present

## 2023-03-31 DIAGNOSIS — Z556 Problems related to health literacy: Secondary | ICD-10-CM | POA: Diagnosis not present

## 2023-04-01 ENCOUNTER — Ambulatory Visit: Payer: Self-pay

## 2023-04-01 NOTE — Patient Instructions (Signed)
Visit Information  Thank you for taking time to visit with me today. Please don't hesitate to contact me if I can be of assistance to you.   Following are the goals we discussed today:  -Review [facility] Health Provider list to identify a new primary care provider   If you are experiencing a Mental Health or Behavioral Health Crisis or need someone to talk to, please call 911  Patient verbalizes understanding of instructions and care plan provided today and agrees to view in MyChart. Active MyChart status and patient understanding of how to access instructions and care plan via MyChart confirmed with patient.     Bevelyn Ngo, BSW, CDP Social Worker, Certified Dementia Practitioner [facility] Care Management  Care Coordination [phone]

## 2023-04-01 NOTE — Patient Outreach (Signed)
  Care Coordination   Initial Visit Note   04/01/2023 Name: Yolanda Garrett [facility] MRN: [id number] DOB: May 18, 1941  Yolanda Garrett is a 82 y.o. year old female who sees Moshe Cipro, NP for primary care. I  spoke with patients son Yolanda Garrett by phone.  What matters to the patients health and wellness today?  The patient is moving across country at the end of the month and needs a new primary care provider.    Goals Addressed             This Visit's Progress    Care Coordination Activities       Care Coordination Interventions: Referral received from patients primary care providers office indicating patient needs assistance with care coordination due to medical complexity Contacted patients son Yolanda Garrett who indicates the patient will move to [facility] at the end of the month. Patients son lives in [place] and has located an independent living community for the patient and her spouse to move to Determined Yolanda Garrett plans to have patient move by train on 5/27 which will arrive in [place] on 5/30 Identified concerns with obtaining a medication list prior to move, needing a 90 days prescription for all medications, identifying a new primary care provider  Contacted Independent Living Community [facility], spoke with the ED Yolanda Garrett who advises there is not an onsite provider. Yolanda Garrett indicates most patients seek primary care from the [facility] provider search and was able to locate several providers in the [place] area who are accepting new patients Provided information to Yolanda Garrett for him to review to select a primary care provider for the patient Attempted to contact the patients primary care providers office regarding medication list and request for 90 day written prescriptions; voice message left requesting a return call         SDOH assessments and interventions completed:  Yes  SDOH Interventions Today     Flowsheet Row Most Recent Value  SDOH Interventions   Food Insecurity Interventions Intervention Not Indicated  Housing Interventions Intervention Not Indicated  Transportation Interventions Intervention Not Indicated  Utilities Interventions Intervention Not Indicated  Financial Strain Interventions Intervention Not Indicated        Care Coordination Interventions:  Yes, provided   Interventions Today    Flowsheet Row Most Recent Value  Chronic Disease   Chronic disease during today's visit Other  General Interventions   General Interventions Discussed/Reviewed General Interventions Discussed, Doctor Visits, Level of Care  Doctor Visits Discussed/Reviewed Doctor Visits Discussed, Doctor Visits Reviewed, PCP        Follow up plan:  SW will continue to follow.    Encounter Outcome:  Pt. Visit Completed   Bevelyn Ngo, BSW, CDP Social Worker, Certified Dementia Practitioner [facility] Care Management  Care Coordination [phone]

## 2023-04-04 NOTE — Patient Outreach (Signed)
  Care Coordination   04/04/2023 Name: Yolanda Garrett [facility] MRN: [id number] DOB: 25-Oct-1941  Yolanda Garrett is a 82 y.o. year old female who sees Moshe Cipro, NP for primary care. I  collaborated on patients primary care providers office to assist with care coordination prior to patients move to [facility] on 5/27.   Goals Addressed             This Visit's Progress    Care Coordination Activities   On track    Care Coordination Interventions: Contacted the patients primary care providers office to advise of families plan for patient to move to [facility] on 5/27 Requested a medication list and written prescriptions for 90 days worth of medication be provided to the patient until she is able to establish with a new primary care provider SW was advised the patients provider is out of the office until Wednesday 5/8 but will be informed of request upon his return SW will continue to follow to assist with care coordination needs        SDOH assessments and interventions completed:  No     Care Coordination Interventions:  Yes, provided   Interventions Today    Flowsheet Row Most Recent Value  Chronic Disease   Chronic disease during today's visit Other  General Interventions   General Interventions Discussed/Reviewed General Interventions Reviewed, Communication with  Communication with PCP/Specialists        Follow up plan:  SW will continue to follow    Encounter Outcome:  Pt. Visit Completed   Mammie Russian, CDP Social Worker, Certified Dementia Practitioner [facility] Care Management  Care Coordination [phone]

## 2023-04-05 DIAGNOSIS — M48062 Spinal stenosis, lumbar region with neurogenic claudication: Secondary | ICD-10-CM | POA: Diagnosis not present

## 2023-04-05 DIAGNOSIS — Z556 Problems related to health literacy: Secondary | ICD-10-CM | POA: Diagnosis not present

## 2023-04-05 DIAGNOSIS — M810 Age-related osteoporosis without current pathological fracture: Secondary | ICD-10-CM | POA: Diagnosis not present

## 2023-04-05 DIAGNOSIS — E559 Vitamin D deficiency, unspecified: Secondary | ICD-10-CM | POA: Diagnosis not present

## 2023-04-05 DIAGNOSIS — E039 Hypothyroidism, unspecified: Secondary | ICD-10-CM | POA: Diagnosis not present

## 2023-04-05 DIAGNOSIS — G20B1 Parkinson's disease with dyskinesia, without mention of fluctuations: Secondary | ICD-10-CM | POA: Diagnosis not present

## 2023-04-05 DIAGNOSIS — G500 Trigeminal neuralgia: Secondary | ICD-10-CM | POA: Diagnosis not present

## 2023-04-05 DIAGNOSIS — M5430 Sciatica, unspecified side: Secondary | ICD-10-CM | POA: Diagnosis not present

## 2023-04-05 NOTE — Patient Outreach (Signed)
  Care Coordination    04/05/2023 Name: Yolanda Garrett [facility] MRN: [id number] DOB: [birth year]  Yolanda Garrett is a 81 y.o. year old female who sees Moshe Cipro, NP for primary care. I  received an inbound call from [place] with [place] [facility]. Discussed request for patient to obtain a medication list and 90 days worth of written prescriptions in anticipation of her move to [place]. Neysa Bonito indicated she will contact the patient to review medications and determine what is needed. Neysa Bonito reports some of the patients medications are prescribed by specialists so she is unable to assist with those.  SDOH assessments and interventions completed:  No     Care Coordination Interventions:  Yes, provided   Interventions Today    Flowsheet Row Most Recent Value  Chronic Disease   Chronic disease during today's visit Other  General Interventions   General Interventions Discussed/Reviewed Communication with  Communication with PCP/Specialists        Follow up plan:  SW will continue to follow    Encounter Outcome:  Pt. Visit Completed   Bevelyn Ngo, Kenard Gower, CDP Social Worker, Certified Dementia Practitioner [facility] Care Management  Care Coordination [phone]

## 2023-04-07 DIAGNOSIS — M810 Age-related osteoporosis without current pathological fracture: Secondary | ICD-10-CM | POA: Diagnosis not present

## 2023-04-07 DIAGNOSIS — M5430 Sciatica, unspecified side: Secondary | ICD-10-CM | POA: Diagnosis not present

## 2023-04-07 DIAGNOSIS — Z556 Problems related to health literacy: Secondary | ICD-10-CM | POA: Diagnosis not present

## 2023-04-07 DIAGNOSIS — G500 Trigeminal neuralgia: Secondary | ICD-10-CM | POA: Diagnosis not present

## 2023-04-07 DIAGNOSIS — M48062 Spinal stenosis, lumbar region with neurogenic claudication: Secondary | ICD-10-CM | POA: Diagnosis not present

## 2023-04-07 DIAGNOSIS — G20B1 Parkinson's disease with dyskinesia, without mention of fluctuations: Secondary | ICD-10-CM | POA: Diagnosis not present

## 2023-04-07 DIAGNOSIS — E039 Hypothyroidism, unspecified: Secondary | ICD-10-CM | POA: Diagnosis not present

## 2023-04-07 DIAGNOSIS — E559 Vitamin D deficiency, unspecified: Secondary | ICD-10-CM | POA: Diagnosis not present

## 2023-04-08 DIAGNOSIS — Z556 Problems related to health literacy: Secondary | ICD-10-CM | POA: Diagnosis not present

## 2023-04-08 DIAGNOSIS — E559 Vitamin D deficiency, unspecified: Secondary | ICD-10-CM | POA: Diagnosis not present

## 2023-04-08 DIAGNOSIS — G500 Trigeminal neuralgia: Secondary | ICD-10-CM | POA: Diagnosis not present

## 2023-04-08 DIAGNOSIS — M48062 Spinal stenosis, lumbar region with neurogenic claudication: Secondary | ICD-10-CM | POA: Diagnosis not present

## 2023-04-08 DIAGNOSIS — G20B1 Parkinson's disease with dyskinesia, without mention of fluctuations: Secondary | ICD-10-CM | POA: Diagnosis not present

## 2023-04-08 DIAGNOSIS — M5430 Sciatica, unspecified side: Secondary | ICD-10-CM | POA: Diagnosis not present

## 2023-04-08 DIAGNOSIS — M810 Age-related osteoporosis without current pathological fracture: Secondary | ICD-10-CM | POA: Diagnosis not present

## 2023-04-08 DIAGNOSIS — E039 Hypothyroidism, unspecified: Secondary | ICD-10-CM | POA: Diagnosis not present

## 2023-04-11 ENCOUNTER — Ambulatory Visit: Payer: Self-pay

## 2023-04-11 NOTE — Patient Instructions (Signed)
Visit Information  Thank you for taking time to visit with me today. Please don't hesitate to contact me if I can be of assistance to you.   Following are the goals we discussed today:  - Contact local PCP offices in [place] to identify a new PCP   If you are experiencing a Mental Health or Behavioral Health Crisis or need someone to talk to, please go to [facility] [address] [place] [phone])  Patient verbalizes understanding of instructions and care plan provided today and agrees to view in MyChart. Active MyChart status and patient understanding of how to access instructions and care plan via MyChart confirmed with patient.     Bevelyn Ngo, BSW, CDP Social Worker, Certified Dementia Practitioner [facility] Care Management  Care Coordination [phone]

## 2023-04-11 NOTE — Patient Outreach (Signed)
  Care Coordination   Follow Up Visit Note   04/11/2023 Name: Jarrod Gaby [facility] MRN: [id number] DOB: 12/27/1940  Yolanda Garrett is a 82 y.o. year old female who sees Moshe Cipro, NP for primary care. I  spoke with patients son Barbara Cower regarding care coordination needs.  What matters to the patients health and wellness today?  Patient needs to obtain 90 days supply of medications prior to her move to [place].    Goals Addressed             This Visit's Progress    Care Coordination Activities       Care Coordination Interventions: Discussed Barbara Cower is working on securing a new primary care provider for the patient once she moves to [place]; Barbara Cower will request to have records transferred once an appointment is secured Reviewed Barbara Cower is awaiting a call from patients primary care providers office regarding 90 day supply of medications Encouraged Barbara Cower to contact this SW as needed        SDOH assessments and interventions completed:  No     Care Coordination Interventions:  Yes, provided   Interventions Today    Flowsheet Row Most Recent Value  Chronic Disease   Chronic disease during today's visit Other  General Interventions   General Interventions Discussed/Reviewed General Interventions Reviewed, Doctor Visits  Doctor Visits Discussed/Reviewed Doctor Visits Reviewed        Follow up plan:  SW will continue to follow    Encounter Outcome:  Pt. Visit Completed   Bevelyn Ngo, Kenard Gower, CDP Social Worker, Certified Dementia Practitioner [facility] Care Management  Care Coordination [phone]

## 2023-04-12 ENCOUNTER — Encounter: Payer: Self-pay | Admitting: Vascular Surgery

## 2023-04-12 ENCOUNTER — Ambulatory Visit: Payer: Medicare Other | Admitting: Vascular Surgery

## 2023-04-12 VITALS — BP 158/87 | HR 85 | Temp 98.4°F | Resp 14 | Ht 60.0 in | Wt 110.0 lb

## 2023-04-12 DIAGNOSIS — I890 Lymphedema, not elsewhere classified: Secondary | ICD-10-CM | POA: Diagnosis not present

## 2023-04-12 NOTE — Progress Notes (Signed)
Patient name: Yolanda Garrett MRN: [id number] DOB: [birth year] Sex: female  REASON FOR CONSULT: Evaluate venous reflux  HPI: Yolanda Garrett is a 82 y.o. female, with hx trigeminal neuralgia, spinal stenosis of lumbar region, and parkinsons that presents for evaluation of lower extremity swelling.  Patient states she was doing pretty well until March when she had a CT scan for her spine and associates that scan with the onset of her bilateral lower extremity swelling.  No known allergic reaction at time of CT.  She denies any history of DVTs.  She has had no previous venous interventions.  She is on Lasix therapy.  States she cannot tolerate compression stockings due to claustrophobia and elevated levels of anxiety.  Past Medical History:  Diagnosis Date   Hypothyroidism    Osteopenia    Osteoporosis    Parkinson's disease 07/30/2021   RLS (restless legs syndrome) 07/30/2021   Thyroid disease    Trigeminal neuralgia    Right V2 distribution   Trigeminal neuralgia    Vitamin D deficiency     Past Surgical History:  Procedure Laterality Date   APPENDECTOMY     BREAST EXCISIONAL BIOPSY Right    CATARACT EXTRACTION Bilateral    gamma knife     procedure   TONSILLECTOMY     TUBAL LIGATION Bilateral    TUBAL LIGATION      Family History  Problem Relation Age of Onset   Pneumonia Mother    Diabetes Sister     SOCIAL HISTORY: Social History   Socioeconomic History   Marital status: Married    Spouse name: Nadine Counts   Number of children: 2   Years of education: Bachelors   Highest education level: Not on file  Occupational History   Occupation: Retired Runner, broadcasting/film/video  Tobacco Use   Smoking status: Never   Smokeless tobacco: Never  Vaping Use   Vaping Use: Never used  Substance and Sexual Activity   Alcohol use: Never   Drug use: Never   Sexual activity: Not on file  Other Topics Concern   Not on file  Social History Narrative   Lives with my husband   Patient is right-handed.    Patient drinks 1-2 cups of caffeine daily   Social Determinants of Health   Financial Resource Strain: Low Risk  (04/01/2023)   Overall Financial Resource Strain (CARDIA)    Difficulty of Paying Living Expenses: Not hard at all  Food Insecurity: No Food Insecurity (04/01/2023)   Hunger Vital Sign    Worried About Running Out of Food in the Last Year: Never true    Ran Out of Food in the Last Year: Never true  Transportation Needs: No Transportation Needs (04/01/2023)   PRAPARE - Administrator, Civil Service (Medical): No    Lack of Transportation (Non-Medical): No  Physical Activity: Not on file  Stress: Not on file  Social Connections: Not on file  Intimate Partner Violence: Not on file    Allergies  Allergen Reactions   Carbidopa-Levodopa Hives and Shortness Of Breath   Amoxicillin    Codeine    Trileptal [Oxcarbazepine]    Ultram [Tramadol] Nausea And Vomiting   Clindamycin/Lincomycin     Hot flashes, stomach pain and clammy skin.   Dilantin [Phenytoin Sodium Extended]     Muscle spasms   Fosamax [Alendronate Sodium]     Right side of jaw stiffened up per pt   Hydromorphone Nausea Only   Sulfa Antibiotics Other (  See Comments)   Lamictal [Lamotrigine] Rash    Current Outpatient Medications  Medication Sig Dispense Refill   Baclofen 5 MG TABS take 1 to 2 tablets by mouth daily as needed for facial pain. (Patient not taking: Reported on 01/18/2023) 30 tablet 0   Calcium Carbonate-Vitamin D 600-400 MG-UNIT tablet Take 2 tablets by mouth daily.      Cholecalciferol (VITAMIN D3) 2000 UNITS TABS Take 1 tablet by mouth daily.     denosumab (PROLIA) 60 MG/ML SOLN injection Inject 60 mg into the skin every 6 (six) months. Administer in upper arm, thigh, or abdomen     hydrochlorothiazide (HYDRODIURIL) 25 MG tablet Take 1 tablet (25 mg total) by mouth daily. 30 tablet 0   HYDROcodone-acetaminophen (NORCO/VICODIN) 5-325 MG tablet Take 1 tablet by mouth every 4 (four) hours  as needed. 10 tablet 0   levothyroxine (SYNTHROID, LEVOTHROID) 100 MCG tablet Take 100 mcg by mouth daily before breakfast.     ondansetron (ZOFRAN-ODT) 4 MG disintegrating tablet Take 1 tablet (4 mg total) by mouth every 8 (eight) hours as needed for nausea or vomiting. 20 tablet 0   predniSONE (DELTASONE) 10 MG tablet Take 2 tablets (20 mg total) by mouth daily. 10 tablet 0   pregabalin (LYRICA) 150 MG capsule Take 1 capsule (150 mg total) by mouth in the morning, at noon, in the evening, and at bedtime. 360 capsule 1   senna-docusate (SENOKOT-S) 8.6-50 MG tablet Take 1 tablet by mouth 2 (two) times daily. 60 tablet 0   No current facility-administered medications for this visit.    REVIEW OF SYSTEMS:  [X]  denotes positive finding, [ ]  denotes negative finding Cardiac  Comments:  Chest pain or chest pressure:    Shortness of breath upon exertion:    Short of breath when lying flat:    Irregular heart rhythm:        Vascular    Pain in calf, thigh, or hip brought on by ambulation:    Pain in feet at night that wakes you up from your sleep:     Blood clot in your veins:    Leg swelling:  x       Pulmonary    Oxygen at home:    Productive cough:     Wheezing:         Neurologic    Sudden weakness in arms or legs:     Sudden numbness in arms or legs:     Sudden onset of difficulty speaking or slurred speech:    Temporary loss of vision in one eye:     Problems with dizziness:         Gastrointestinal    Blood in stool:     Vomited blood:         Genitourinary    Burning when urinating:     Blood in urine:        Psychiatric    Major depression:         Hematologic    Bleeding problems:    Problems with blood clotting too easily:        Skin    Rashes or ulcers:        Constitutional    Fever or chills:      PHYSICAL EXAM: There were no vitals filed for this visit.  GENERAL: The patient is a well-nourished female, in no acute distress. The vital signs are  documented above. CARDIAC: There is a regular rate and rhythm.  VASCULAR:  Bilateral DP pulses palpable Positive Stemmer sign both lower extremities  Peau de orange skin and 2+ edema lower extremities  PULMONARY: No respiratory distress. ABDOMEN: Soft and non-tender.  MUSCULOSKELETAL: There are no major deformities or cyanosis. NEUROLOGIC: No focal weakness or paresthesias are detected. PSYCHIATRIC: The patient has a normal affect.  DATA:    Lower Venous Reflux Study   Patient Name:  Yolanda Garrett  Date of Exam:   03/31/2023  Medical Rec #: [id number]       Accession #:    [id number]  Date of Birth: 17-Dec-1940        Patient Gender: F  Patient Age:   65 years  Exam Location:  [facility]  Procedure:      VAS [place] LOWER EXTREMITY VENOUS REFLUX  Referring Phys: Sherald Hess    ---------------------------------------------------------------------------  -----    Indications: Edema, and Erythema.    Risk Factors: Immobility.  Limitations: Body habitus and limited mobility.  Comparison Study: 02/14/23 Right LE DVT - negative   Performing Technologist: Lowell Guitar RVT, RDMS     Examination Guidelines: A complete evaluation includes B-mode imaging,  spectral  Doppler, color Doppler, and power Doppler as needed of all accessible  portions  of each vessel. Bilateral testing is considered an integral part of a  complete  examination. Limited examinations for reoccurring indications may be  performed  as noted. The reflux portion of the exam is performed with the patient in  reverse Trendelenburg.  Significant venous reflux is defined as >500 ms in the superficial venous  system, and >1 second in the deep venous system.     Venous Reflux Times  +--------------+---------+------+-----------+------------+--------+  RIGHT        Reflux NoRefluxReflux TimeDiameter cmsComments                          Yes                                    +--------------+---------+------+-----------+------------+--------+  CFV          no                                              +--------------+---------+------+-----------+------------+--------+  FV prox       no                                              +--------------+---------+------+-----------+------------+--------+  FV mid        no                                              +--------------+---------+------+-----------+------------+--------+  FV dist       no                                              +--------------+---------+------+-----------+------------+--------+  Popliteal    no                                              +--------------+---------+------+-----------+------------+--------+  GSV at [facility]    no                            0.35              +--------------+---------+------+-----------+------------+--------+  GSV prox thighno                            0.54    branches  +--------------+---------+------+-----------+------------+--------+  GSV mid thigh no                            0.25              +--------------+---------+------+-----------+------------+--------+  GSV dist thighno                            0.39              +--------------+---------+------+-----------+------------+--------+  GSV at knee   no                            0.38              +--------------+---------+------+-----------+------------+--------+  GSV prox calf no                            0.29    branches  +--------------+---------+------+-----------+------------+--------+  SSV Pop Fossa no                            0.27              +--------------+---------+------+-----------+------------+--------+  SSV prox calf no                            0.21              +--------------+---------+------+-----------+------------+--------+        Summary:  Right:  - No evidence of deep vein  thrombosis seen in the right lower extremity,  from the common femoral through the popliteal veins.  - No evidence of superficial venous thrombosis in the right lower  extremity.    - There is no evidence of venous reflux seen in the right lower extremity.    - No evidence of superficial venous reflux seen in the right greater  saphenous vein.  - No evidence of superficial venous reflux seen in the right short  saphenous vein.    *See table(s) above for measurements and observations.   Electronically signed by Lemar Livings MD on 03/31/2023 at 10:14:59 AM.     Assessment/Plan:  82 year old female presents for evaluation of bilateral lower extremity swelling.  On exam she has notable peau de orange skin with a positive Stemmer sign that is consistent with lymphedema.  I discussed that her reflux study shows no evidence of DVT and she has no evidence of valvular reflux to suggest this is chronic venous insufficiency.  I discussed that the standard of care is conservative management with leg elevation, exercise and compression.  She states she cannot wear compression stockings due to her claustrophobia and I suggested Ace wrap's as an alternative and ultimately if this fails she could try and get some lymphedema pumps in the future.  She is moving to [facility] next week.  She has no evidence of arterial insufficiency and has easily palpable pedal pulses.  We are happy to see her PRN.    Cephus Shelling, MD [facility] Office: [phone]

## 2023-04-13 ENCOUNTER — Encounter: Payer: Medicare Other | Admitting: Vascular Surgery

## 2023-04-19 NOTE — Patient Outreach (Signed)
  Care Coordination   Follow Up Visit Note   04/19/2023 Name: Yolanda Garrett [facility] MRN: [id number] DOB: Nov 23, 1941  [name] is a 82 y.o. year old female who sees Moshe Cipro, NP for primary care. I  engaged with patients son via e-mail correspondence to assist with care coordination needs prior to patients move to [place], [place].  What matters to the patients health and wellness today?  Patients son would like the patient to be referred to a provider in [facility] for Botox injections.    Goals Addressed             This Visit's Progress    COMPLETED: Care Coordination Activities       Care Coordination Interventions: Confirmed patient has received 90 days prescription for medications Determined patients son is working on getting a referral from the patients neurosurgeon for Botox injections once she moves Encouraged Barbara Cower to identify a desired provider in [place] in order to provide the patients current neurosurgeon with an office to send the referral to         SDOH assessments and interventions completed:  No     Care Coordination Interventions:  Yes, provided   Interventions Today    Flowsheet Row Most Recent Value  Chronic Disease   Chronic disease during today's visit Other  General Interventions   General Interventions Discussed/Reviewed General Interventions Reviewed, Doctor Visits  Doctor Visits Discussed/Reviewed Doctor Visits Discussed        Follow up plan: No further intervention required. The patient plans to move into Independent Living in [place], [place] on 5/27. Her son Barbara Cower is assisting with the move and has received 90 days worth of medication as requested. He has been provided with information on how to establish the patient with a new PCP once she moves to [place]. Barbara Cower has been instructed how to obtain a referral for Botox injections once she relocates.    Encounter Outcome:  Pt. Visit Completed   Bevelyn Ngo, BSW, CDP Social Worker, Certified Dementia Practitioner [facility]  Care Coordination [phone]

## 2023-09-07 ENCOUNTER — Ambulatory Visit: Payer: Medicare Other | Admitting: Psychiatry

## 2024-01-19 ENCOUNTER — Ambulatory Visit: Payer: Medicare Other | Admitting: Psychiatry
